# Patient Record
Sex: Male | Born: 1980
Health system: Southern US, Community
[De-identification: ages and names within clinical notes are randomized; demographics above are authoritative.]

## PROBLEM LIST (undated history)

## (undated) DIAGNOSIS — F429 Obsessive-compulsive disorder, unspecified: Secondary | ICD-10-CM

## (undated) DIAGNOSIS — F329 Major depressive disorder, single episode, unspecified: Secondary | ICD-10-CM

## (undated) DIAGNOSIS — F32A Depression, unspecified: Secondary | ICD-10-CM

## (undated) DIAGNOSIS — G4700 Insomnia, unspecified: Secondary | ICD-10-CM

## (undated) HISTORY — DX: Obsessive-compulsive disorder, unspecified: F42.9

## (undated) HISTORY — DX: Depression, unspecified: F32.A

## (undated) HISTORY — DX: Major depressive disorder, single episode, unspecified: F32.9

## (undated) HISTORY — DX: Insomnia, unspecified: G47.00

---

## 2002-03-28 ENCOUNTER — Ambulatory Visit (HOSPITAL_COMMUNITY): Admission: RE | Admit: 2002-03-28 | Discharge: 2002-03-28 | Payer: Self-pay | Admitting: Family Medicine

## 2002-03-28 ENCOUNTER — Encounter: Payer: Self-pay | Admitting: Family Medicine

## 2002-09-12 ENCOUNTER — Emergency Department (HOSPITAL_COMMUNITY): Admission: EM | Admit: 2002-09-12 | Discharge: 2002-09-12 | Payer: Self-pay | Admitting: Emergency Medicine

## 2002-09-12 ENCOUNTER — Encounter: Payer: Self-pay | Admitting: Emergency Medicine

## 2003-07-30 ENCOUNTER — Other Ambulatory Visit (HOSPITAL_COMMUNITY): Admission: RE | Admit: 2003-07-30 | Discharge: 2003-08-04 | Payer: Self-pay | Admitting: Psychiatry

## 2003-07-30 ENCOUNTER — Emergency Department (HOSPITAL_COMMUNITY): Admission: EM | Admit: 2003-07-30 | Discharge: 2003-07-30 | Payer: Self-pay | Admitting: *Deleted

## 2004-07-12 ENCOUNTER — Emergency Department (HOSPITAL_COMMUNITY): Admission: EM | Admit: 2004-07-12 | Discharge: 2004-07-12 | Payer: Self-pay | Admitting: Emergency Medicine

## 2004-07-13 ENCOUNTER — Ambulatory Visit: Payer: Self-pay | Admitting: Psychology

## 2004-07-20 ENCOUNTER — Ambulatory Visit: Payer: Self-pay | Admitting: Orthopedic Surgery

## 2007-11-02 ENCOUNTER — Emergency Department (HOSPITAL_COMMUNITY): Admission: EM | Admit: 2007-11-02 | Discharge: 2007-11-02 | Payer: Self-pay | Admitting: Emergency Medicine

## 2007-11-02 ENCOUNTER — Emergency Department (HOSPITAL_COMMUNITY): Admission: EM | Admit: 2007-11-02 | Discharge: 2007-11-03 | Payer: Self-pay | Admitting: Emergency Medicine

## 2008-12-16 ENCOUNTER — Emergency Department (HOSPITAL_COMMUNITY): Admission: EM | Admit: 2008-12-16 | Discharge: 2008-12-16 | Payer: Self-pay | Admitting: Emergency Medicine

## 2008-12-17 ENCOUNTER — Other Ambulatory Visit (HOSPITAL_COMMUNITY): Admission: RE | Admit: 2008-12-17 | Discharge: 2008-12-22 | Payer: Self-pay | Admitting: Psychiatry

## 2010-06-13 ENCOUNTER — Ambulatory Visit (HOSPITAL_COMMUNITY): Admission: RE | Admit: 2010-06-13 | Discharge: 2010-06-13 | Payer: Self-pay | Admitting: Family Medicine

## 2010-06-14 ENCOUNTER — Ambulatory Visit (HOSPITAL_COMMUNITY): Admission: AD | Admit: 2010-06-14 | Discharge: 2010-06-14 | Payer: Self-pay | Admitting: Urology

## 2010-10-18 LAB — SURGICAL PCR SCREEN
MRSA, PCR: NEGATIVE
Staphylococcus aureus: NEGATIVE

## 2010-12-20 NOTE — Discharge Summary (Signed)
NAMEKEANAN, MELANDER               ACCOUNT NO.:  [id number]   MEDICAL RECORD NO.:  [id number]          PATIENT TYPE:  EMS   LOCATION:  ED                            FACILITY:  [facility]   PHYSICIAN:  Donna Bernard, M.D.DATE OF BIRTH:  [birth year]   DATE OF ADMISSION:  12/16/2008  DATE OF DISCHARGE:  05/12/2010LH                               DISCHARGE SUMMARY   CHIEF COMPLAINT:  Rapid heart rate, shortness of breath, worsening of  depression.   HISTORY OF PRESENT ILLNESS:  This patient is a 30 year old white male  with longstanding history of obsessive-compulsive disorder.  He has been  on medication for this for many years.  He generally takes Luvox, his  current dose is 200 mg daily.  About 5 years ago, he had a bout of  depression.  He saw a psychiatrist and there was a question of bipolar  at that time.  The patient did not stay very long with the psychiatrist  at that point.  Recently, he has had a couple of spells where he gets  extremely nervous and anxious, this was accompanied by rapid heart rate.  He feels a sense of shortness of breath.  He also notes in recent weeks,  he has had periods of feeling very down and low.  He is not actively  planned suicide, but he has certainly has thought about death and shares  some kind of thoughts of what is the use at times when it comes for  him to stand alive.  In other words, he has thought of dying and he has  even thought of potentially harming himself, but he has no active plan.  He states today he has no active desire to hurt or kill himself, and he  has been thinking about it frequently lately.  The patient has been  feeling down.  He has had some crying spells.  His frustration level was  rising more significantly lately.  He does not enjoy his current job.  He is frustrated by the fact that he has no regular relationship these  days and makes him wonder if he has made mistakes in this regard in the  past.  Currently, no chest  pain or shortness of breath or rapid heart  rate.   CURRENT MEDICATIONS:  Luvox 200 mg daily.   ADDENDUM:  The patient is on Wellbutrin 150 SR b.i.d., this was  initiated within the past week and a half.  The patient cannot tell  whether his symptoms have worsened since this but these certainly have  not improved.   PHYSICAL EXAMINATION:  VITAL SIGNS:  BP 150/80, pulse 80, respiratory  rate 18 breaths per minute.  GENERAL:  The patient is alert, appropriate affect, somewhat subdued,  not cheerful, oriented x3.  No delusions.  No hallucinations.  Admits to  feeling down, see present illness.  HEENT:  Normal.  NECK:  Supple.  LUNGS:  Clear.  HEART:  Regular rate and rhythm.   IMPRESSION:  Depression worsening, generally Wellbutrin does not lead to  this, but I have asked the patient to hold  off on Wellbutrin for now.  The patient is to maintain Luvox.  I am quite concerned about this  patient.  He is accompanied by both his parents today, both of whom are  very concerned.  I spoke with the intake person at [facility]  down at [facility].  I do not know whether they will recommend inpatient  management or not.  I do think, though they are getting him down there this evening, is it  the absolutely right thing to do and we will increase their speed of  [facility].  I have advised them that at  this condition, I really think he needs a psychiatrist long term.  Questions were answered.  Plan as above.      Donna Bernard, M.D.  Electronically Signed     WSL/MEDQ  D:  12/21/2008  T:  12/22/2008  Job:  [id number]

## 2011-03-20 ENCOUNTER — Ambulatory Visit (HOSPITAL_COMMUNITY)
Admission: RE | Admit: 2011-03-20 | Discharge: 2011-03-20 | Disposition: A | Discharge status: Home / Self Care | Payer: BC Managed Care – PPO | Source: Ambulatory Visit | Attending: Family Medicine | Admitting: Family Medicine

## 2011-03-20 ENCOUNTER — Other Ambulatory Visit: Payer: Self-pay | Admitting: Family Medicine

## 2011-03-20 DIAGNOSIS — F17200 Nicotine dependence, unspecified, uncomplicated: Secondary | ICD-10-CM | POA: Insufficient documentation

## 2011-03-20 DIAGNOSIS — R52 Pain, unspecified: Secondary | ICD-10-CM

## 2011-03-20 DIAGNOSIS — R0789 Other chest pain: Secondary | ICD-10-CM | POA: Insufficient documentation

## 2011-05-01 LAB — CBC
HCT: 37 — ABNORMAL LOW
Hemoglobin: 13.3
MCHC: 36
MCV: 87.3
Platelets: 182
RBC: 4.24
RDW: 13.3
WBC: 8.2

## 2011-05-01 LAB — URINALYSIS, ROUTINE W REFLEX MICROSCOPIC
Bilirubin Urine: NEGATIVE
Glucose, UA: NEGATIVE
Leukocytes, UA: NEGATIVE
Nitrite: NEGATIVE
Protein, ur: 100 — AB
Specific Gravity, Urine: 1.025
Urobilinogen, UA: 0.2
pH: 6

## 2011-05-01 LAB — URINE MICROSCOPIC-ADD ON

## 2011-05-01 LAB — BASIC METABOLIC PANEL
BUN: 9
CO2: 25
Calcium: 9.3
Chloride: 104
Creatinine, Ser: 1.4
GFR calc Af Amer: >60
GFR calc non Af Amer: >60
Glucose, Bld: 111 — ABNORMAL HIGH
Potassium: 3.1 — ABNORMAL LOW
Sodium: 137

## 2011-05-01 LAB — DIFFERENTIAL
Basophils Absolute: 0
Basophils Relative: 0
Eosinophils Absolute: 0.1
Eosinophils Relative: 2
Lymphocytes Relative: 28
Lymphs Abs: 2.3
Monocytes Absolute: 0.4
Monocytes Relative: 5
Neutro Abs: 5.4
Neutrophils Relative %: 65

## 2012-06-07 IMAGING — CT CT ABD-PELV W/O CM
3 of 4 series · 9 of 46 positions shown, 16 images · non-contrast
Comparison: 11/02/2007

CLINICAL DATA: Gross hematuria.  Left flank pain.  Urolithiasis.

CT ABDOMEN AND PELVIS WITHOUT CONTRAST
TECHNIQUE: Multidetector CT imaging of the abdomen and pelvis was
performed following the standard protocol without intravenous
contrast.

[Series 3: lung 5.0 b60f · axial · 0.67mm/px · z∈[-180,-100]mm · 5 of 26 slices shown, 10 images]
[im 5/26  soft-tissue]
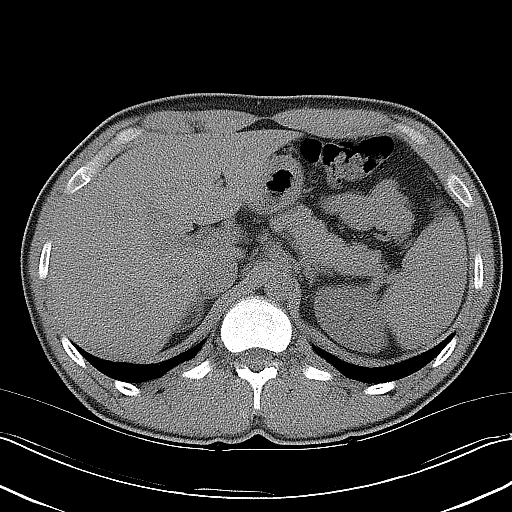
[im 5/26  bone]
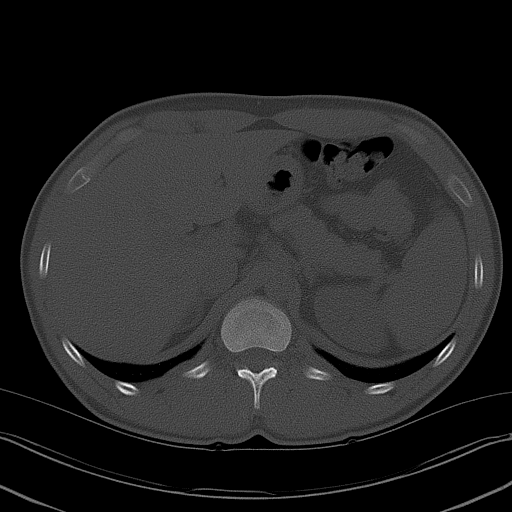
[im 9/26  soft-tissue]
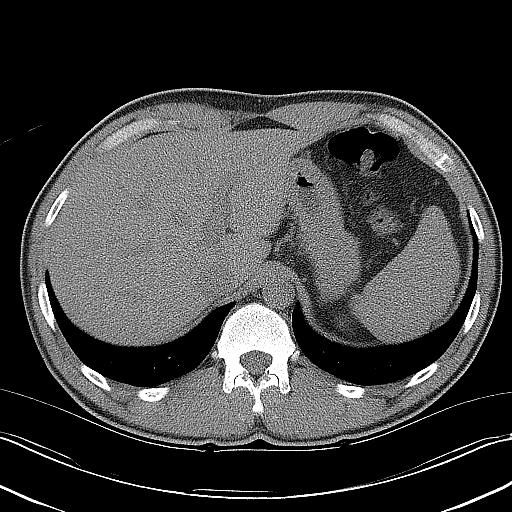
[im 9/26  lung]
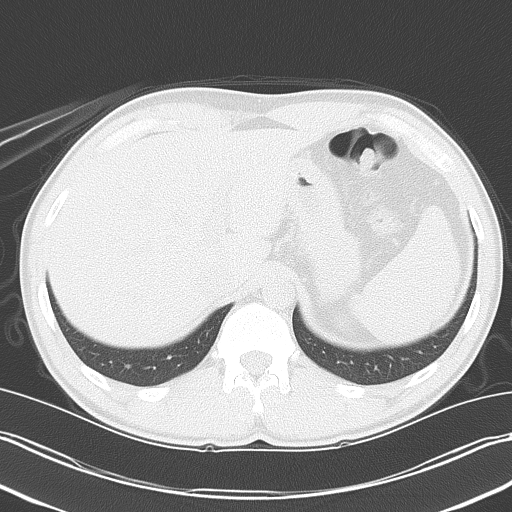
[im 13/26  soft-tissue]
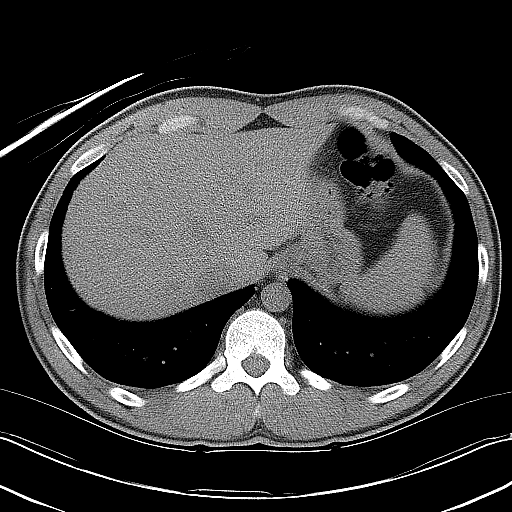
[im 13/26  lung]
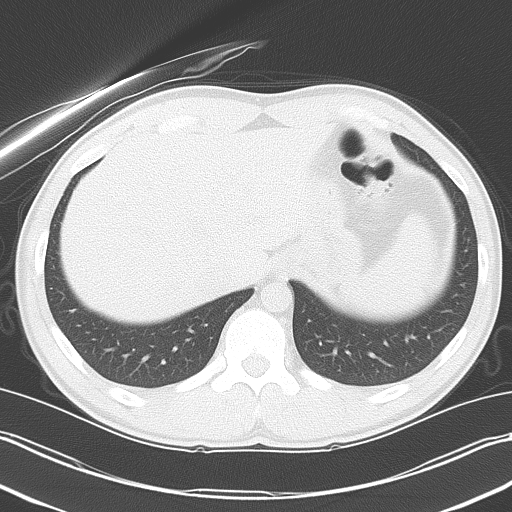
[im 17/26  soft-tissue]
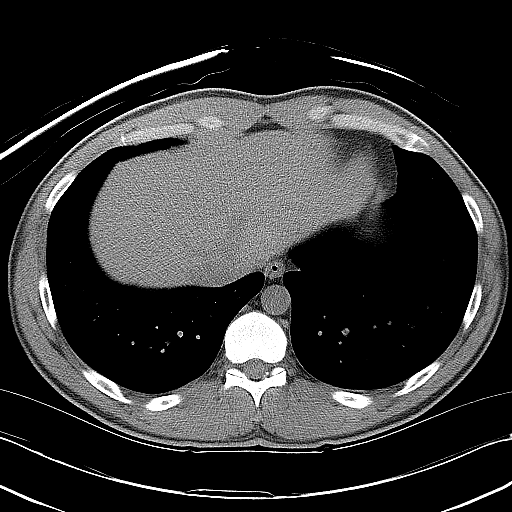
[im 17/26  lung]
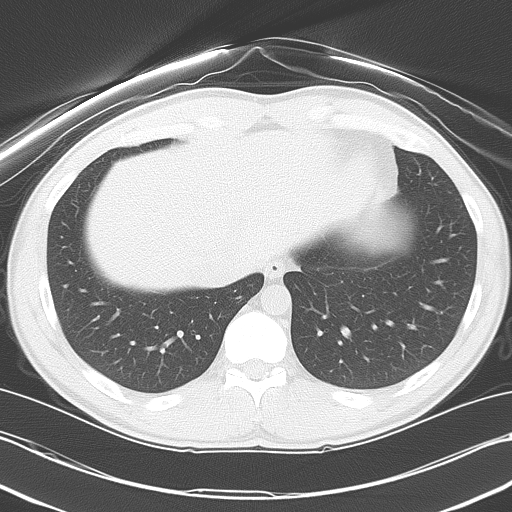
[im 21/26  soft-tissue]
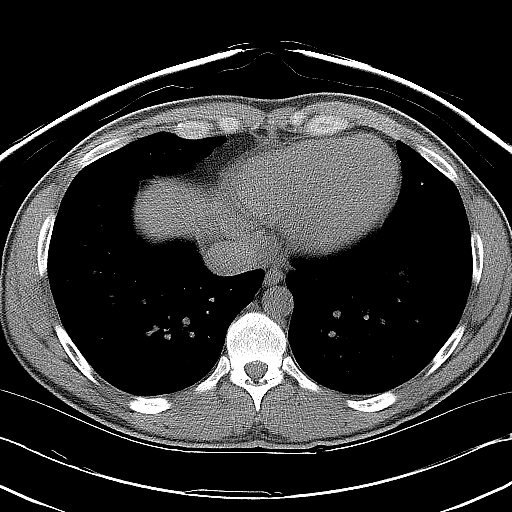
[im 21/26  lung]
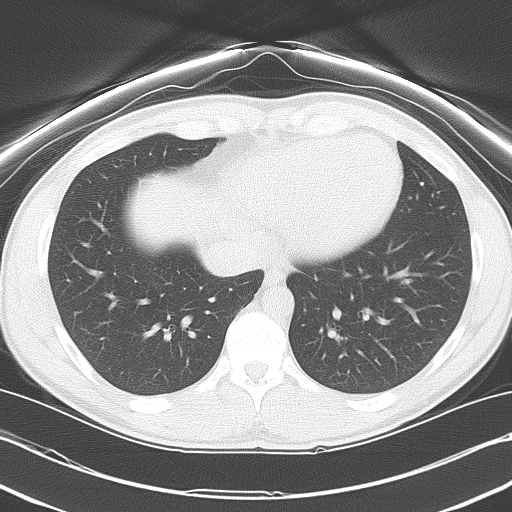

[Series 4: mpr coronal (id) · coronal · 0.69mm/px · 3 of 78 slices shown, 4 images]
[im 26/78  soft-tissue]
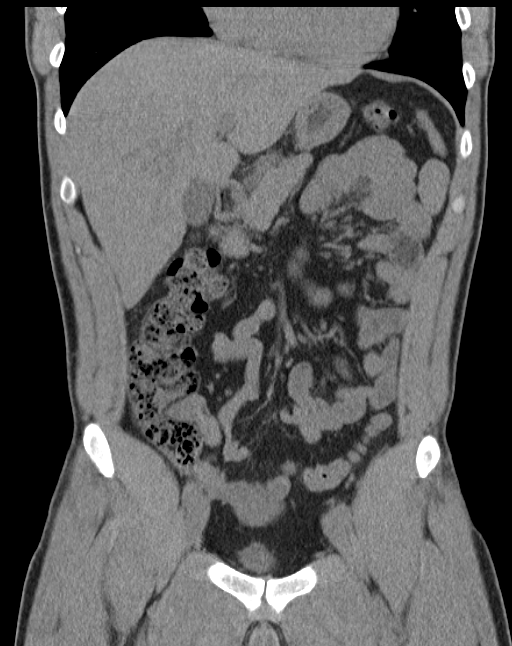
[im 35/78  soft-tissue]
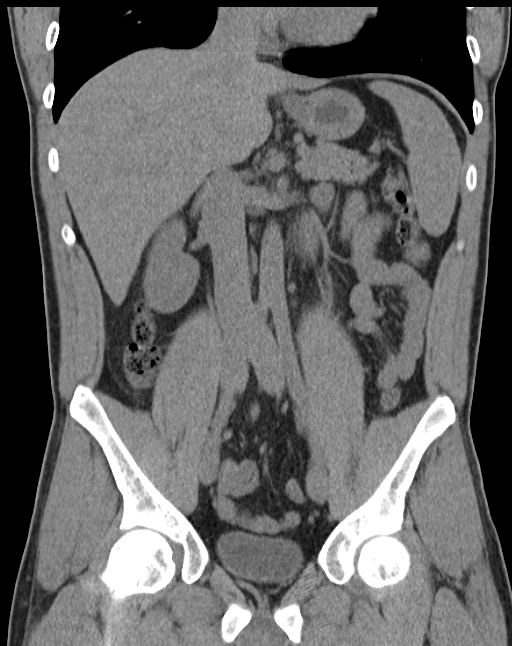
[im 35/78  bone]
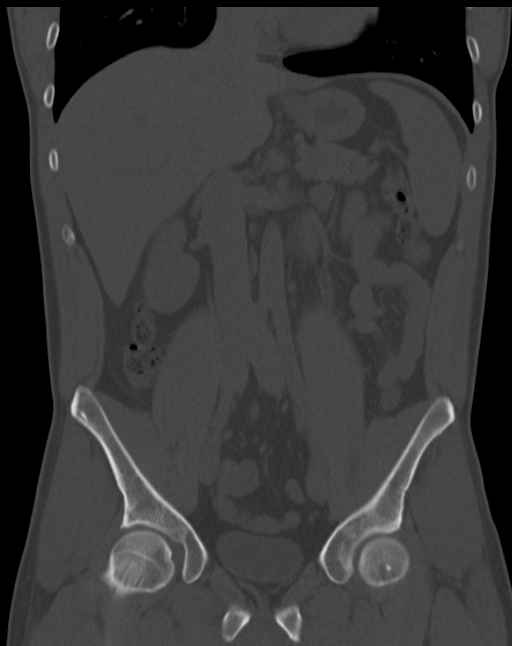
[im 43/78  soft-tissue]
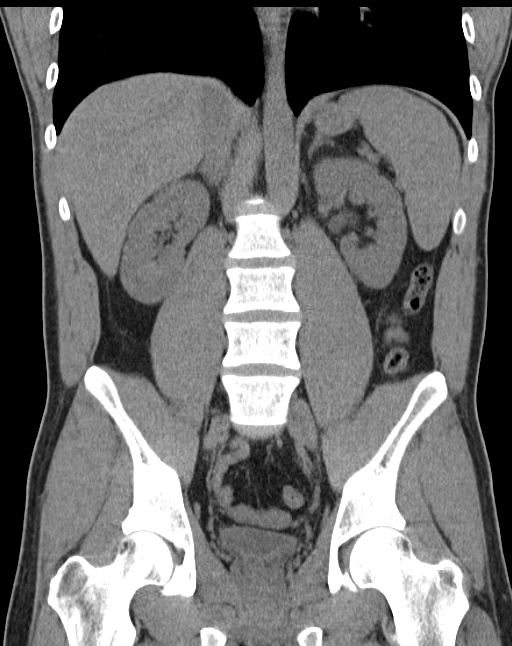

[Series 5: mpr sagittal (id) · sagittal · 0.50mm/px · 1 of 108 slices shown, 2 images]
[im 36/108  soft-tissue]
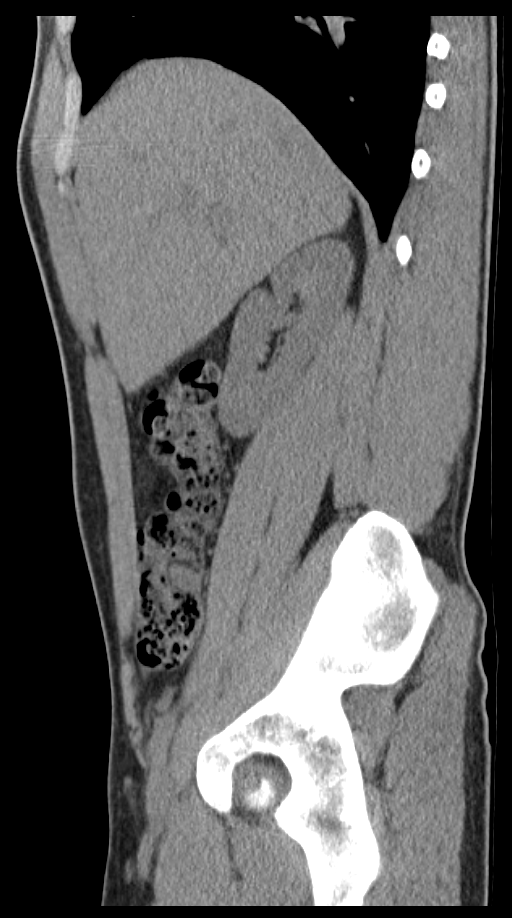
[im 36/108  bone]
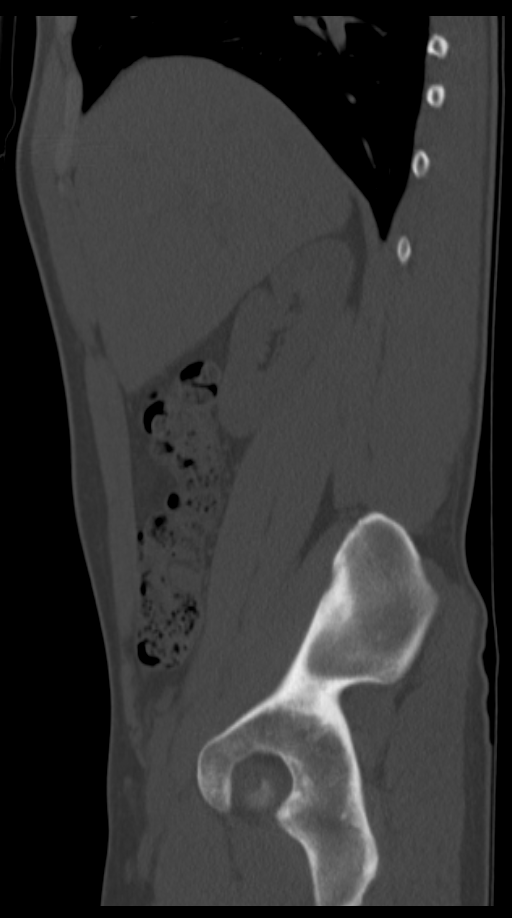

[9 of 46 positions shown; findings below may reference images not displayed]

FINDINGS: Mild left hydronephrosis and ureterectasis is seen.  A 4
mm calculus is seen in the distal left ureter, approximately 1 cm
from the ureterovesicle junction.  No evidence of intrarenal
calculi.

The other abdominal parenchymal organs have a normal appearance on
this noncontrast study.  No soft tissue masses are identified.  No
evidence of inflammatory process or abnormal fluid collections.
Unopacified bowel has a normal appearance.
IMPRESSION: 4 mm distal left ureteral calculus, causing mild left
hydronephrosis.

## 2012-12-27 ENCOUNTER — Ambulatory Visit (INDEPENDENT_AMBULATORY_CARE_PROVIDER_SITE_OTHER): Payer: 59 | Admitting: Family Medicine

## 2012-12-27 ENCOUNTER — Encounter: Payer: Self-pay | Admitting: Family Medicine

## 2012-12-27 VITALS — BP 120/72 | Temp 98.7°F | Ht 69.75 in | Wt 187.0 lb

## 2012-12-27 DIAGNOSIS — J029 Acute pharyngitis, unspecified: Secondary | ICD-10-CM

## 2012-12-27 MED ORDER — AZITHROMYCIN 250 MG PO TABS: ORAL_TABLET | ORAL | Status: DC

## 2012-12-27 NOTE — Progress Notes (Signed)
  Subjective:    Patient ID: Robert Walsh, male    DOB: [birth year], 32 y.o.   MRN: [id number]  Sore Throat  This is a new problem. The current episode started in the past 7 days. The problem has been unchanged. Neither side of throat is experiencing more pain than the other. Maximum temperature: 99.5. The fever has been present for less than 1 day. The pain is at a severity of 4/10. The pain is mild. Associated symptoms comments: Nausea, body aches. He has tried cool liquids for the symptoms. The treatment provided mild relief.    Past medical history benign family history benign  Review of Systems He denies any high fevers. He does state he feels bad. Moderate discomfort.    Objective:   Physical Exam Eardrums are normal throat erythematous with exudate on the right side neck is supple minimal adenopathy patient looks to fill ill but he does not appear toxic lungs are clear heart is regular pulses normal no masses are felt.  Rapid strep test negative.     Assessment & Plan:  Pharyngitis-we'll go ahead and cover for possibility of infection with Zithromax. Patient was told if over the next 5 or 6 days his throat doesn't improve I would like to do blood work including CBC and mono testing. Patient will call us if ongoing troubles.

## 2012-12-28 LAB — STREP A DNA PROBE: GASP: NEGATIVE

## 2013-01-13 ENCOUNTER — Other Ambulatory Visit (HOSPITAL_COMMUNITY): Payer: Self-pay | Admitting: Family Medicine

## 2013-01-14 ENCOUNTER — Encounter: Payer: Self-pay | Admitting: *Deleted

## 2013-02-28 ENCOUNTER — Encounter: Payer: Self-pay | Admitting: Family Medicine

## 2013-02-28 ENCOUNTER — Ambulatory Visit (INDEPENDENT_AMBULATORY_CARE_PROVIDER_SITE_OTHER): Payer: PRIVATE HEALTH INSURANCE | Admitting: Family Medicine

## 2013-02-28 VITALS — BP 120/84 | HR 70 | Wt 188.5 lb

## 2013-02-28 DIAGNOSIS — F429 Obsessive-compulsive disorder, unspecified: Secondary | ICD-10-CM

## 2013-02-28 DIAGNOSIS — G4700 Insomnia, unspecified: Secondary | ICD-10-CM

## 2013-02-28 MED ORDER — SERTRALINE HCL 100 MG PO TABS: 100.0000 mg | ORAL_TABLET | Freq: Every day | ORAL | Status: DC

## 2013-02-28 NOTE — Progress Notes (Signed)
  Subjective:    Patient ID: Robert Walsh, male    DOB: 1980/09/17, 32 y.o.   MRN: [id number]  HPI  Taking med regularly.  Energy decent. No suicidal or homicidal thoughts. Exercising regularly. Some trouble sleeping at night. This is a long-standing issue.  Feeling down at times. Overall god control  Review of Systems No chest pain no abdominal pain no back pain ROS otherwise negative    Objective:   Physical Exam  Alert HEENT normal. Lungs clear. Heart regular in rhythm. Neuro intact.      Assessment & Plan:  Impression insomnia with element of depression and OCD clinically stable at this time. Plan maintain same meds. Diet exercise discussed in encourage. Check in 6 months. WSL

## 2013-03-02 DIAGNOSIS — F429 Obsessive-compulsive disorder, unspecified: Secondary | ICD-10-CM | POA: Insufficient documentation

## 2013-03-02 DIAGNOSIS — G4700 Insomnia, unspecified: Secondary | ICD-10-CM | POA: Insufficient documentation

## 2013-03-14 IMAGING — CR DG CHEST 2V
2 series · 2 of 2 positions shown · non-contrast
Comparison: 11/02/2007

CLINICAL DATA: Right anterior chest pain, smoker

CHEST - 2 VIEW

[view not recorded (1 of 2)]
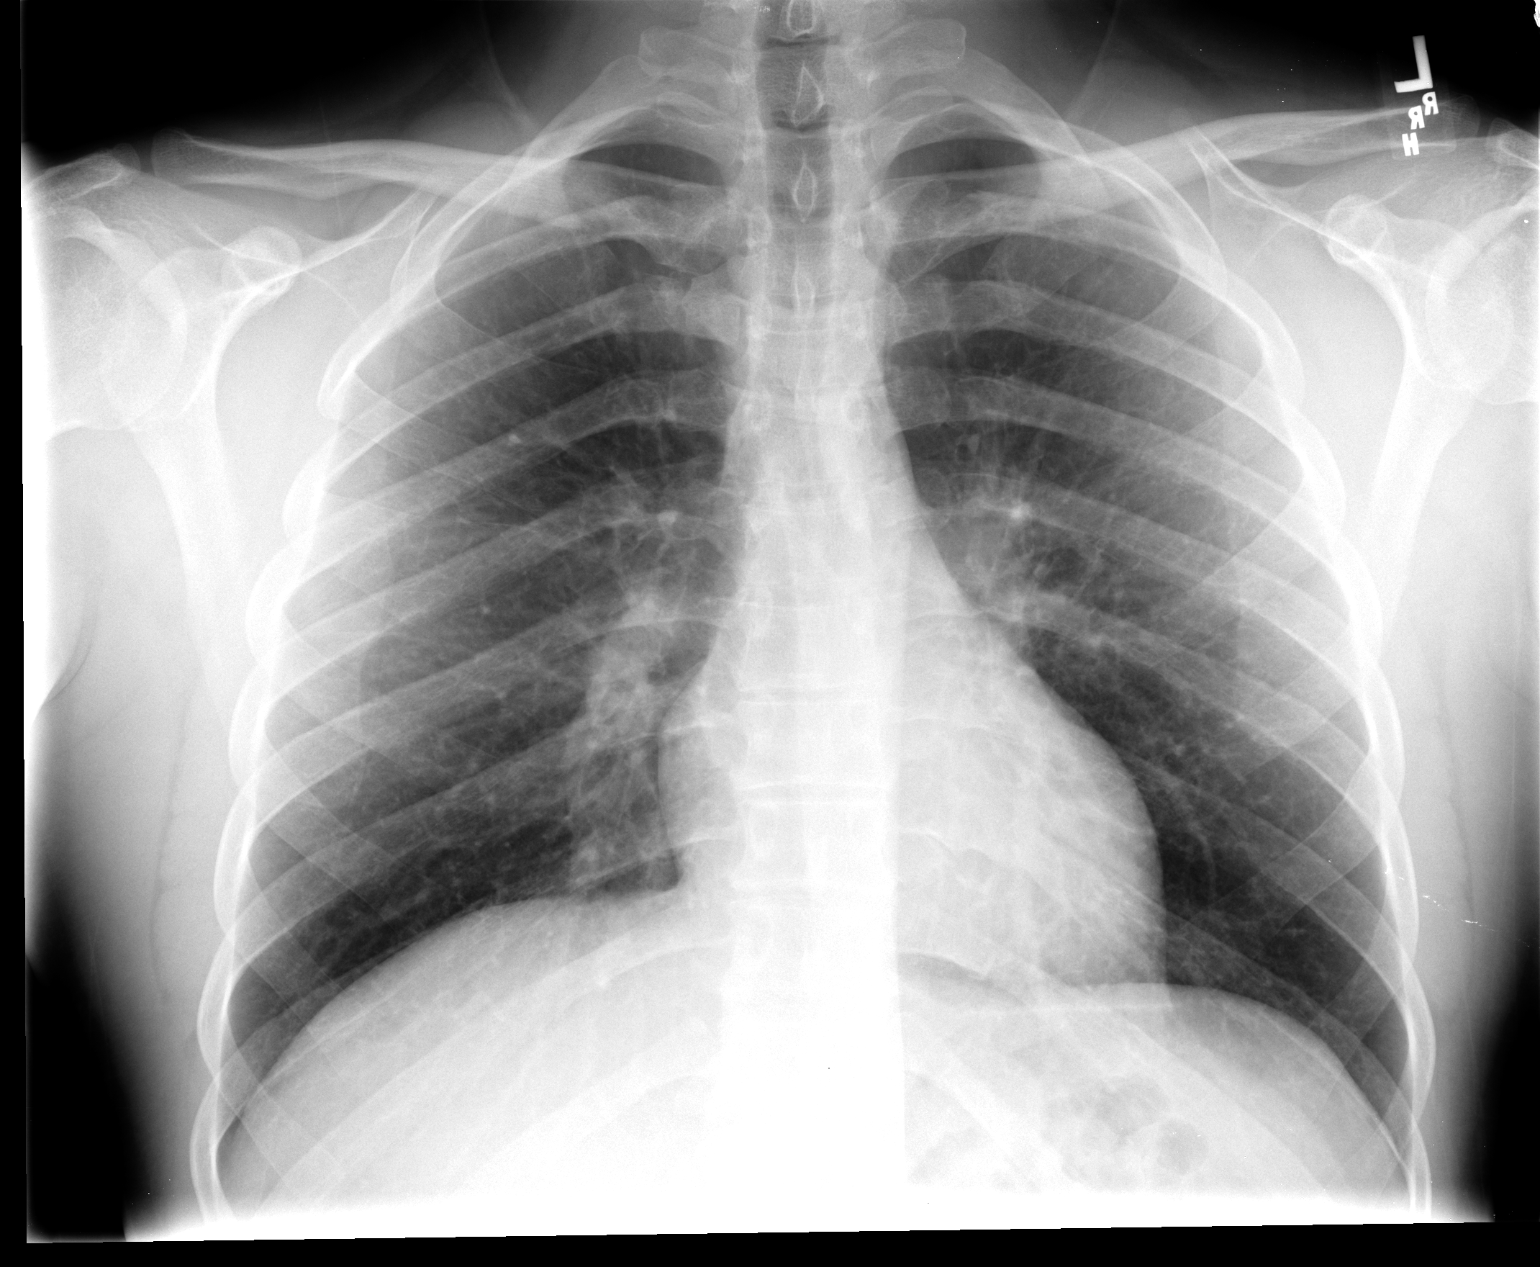

[view not recorded (2 of 2)]
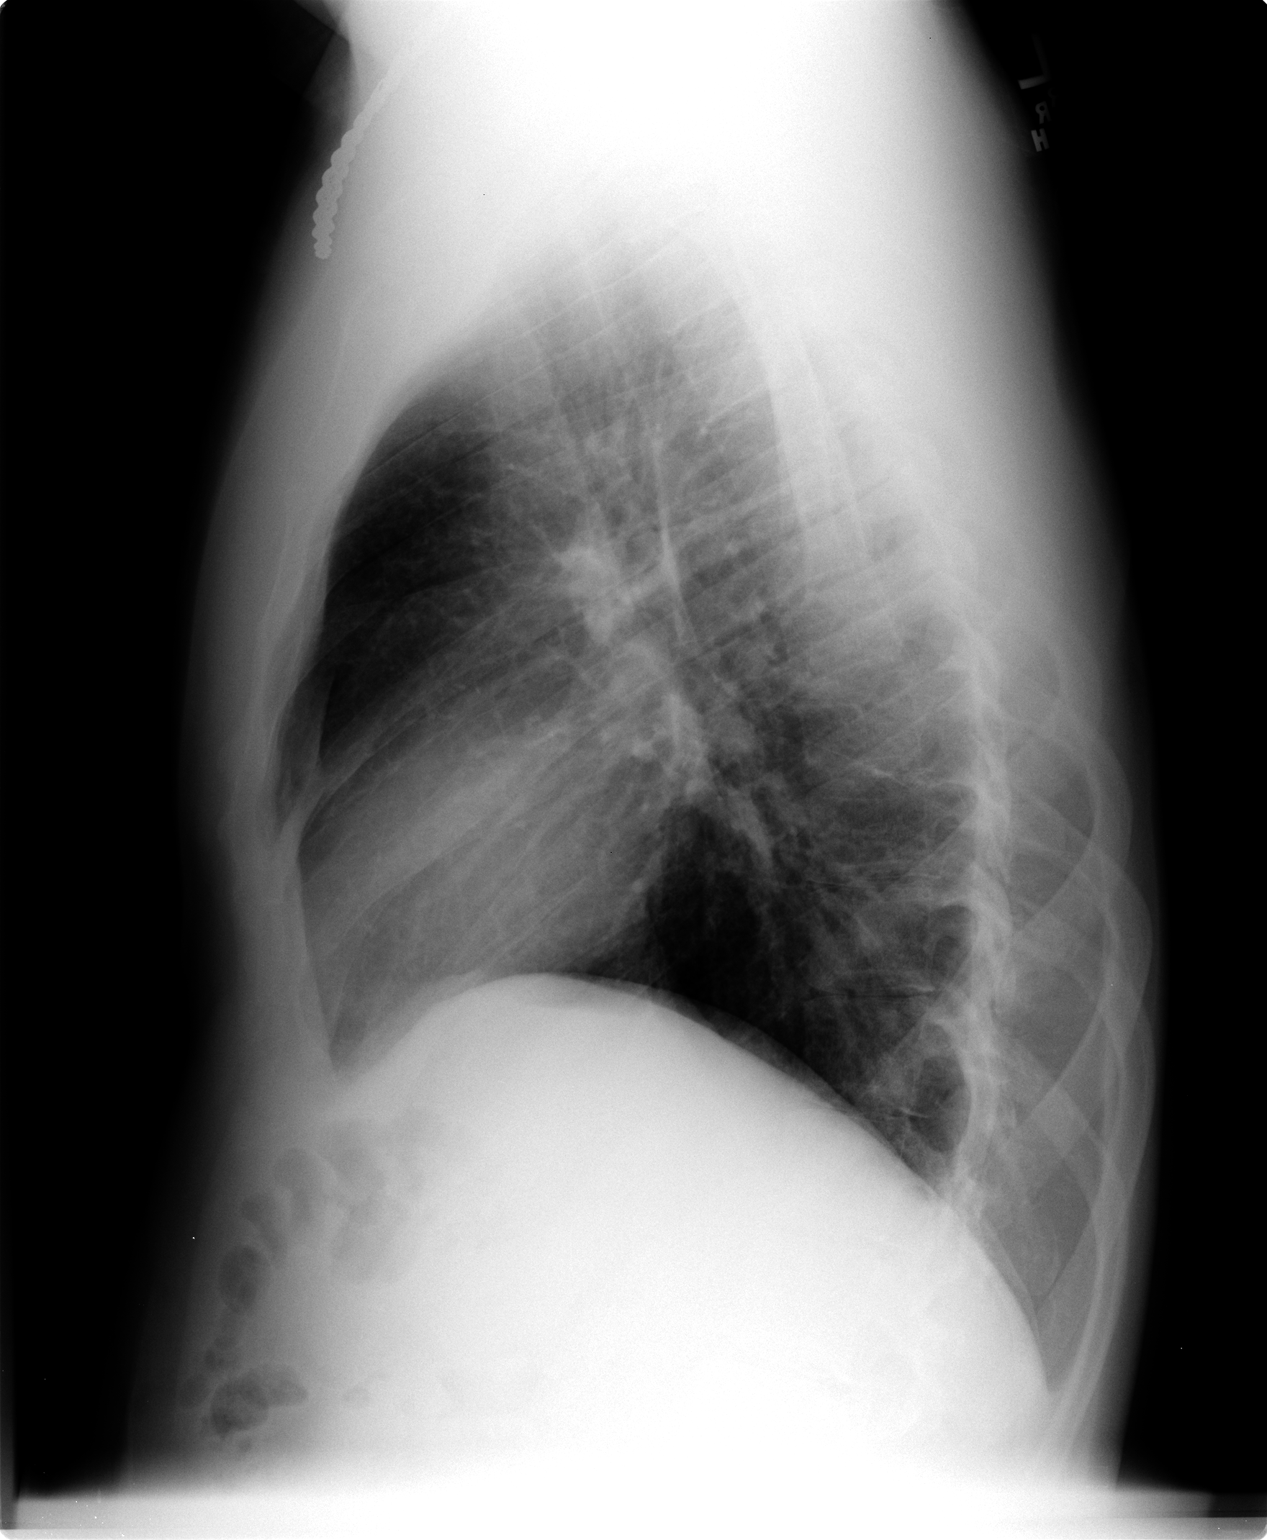

[2 of 2 positions shown; findings below may reference images not displayed]

FINDINGS: Normal heart size, mediastinal contours, and pulmonary vascularity.
Minimal chronic peribronchial thickening.
No pulmonary infiltrate, pleural effusion or pneumothorax.
No acute osseous findings.
IMPRESSION: Minimal chronic bronchitic changes.
No acute abnormalities.

## 2014-02-12 ENCOUNTER — Encounter: Payer: Self-pay | Admitting: Family Medicine

## 2014-02-12 ENCOUNTER — Ambulatory Visit (INDEPENDENT_AMBULATORY_CARE_PROVIDER_SITE_OTHER): Payer: 59 | Admitting: Family Medicine

## 2014-02-12 VITALS — BP 132/94 | Ht 69.0 in | Wt 172.0 lb

## 2014-02-12 DIAGNOSIS — G4700 Insomnia, unspecified: Secondary | ICD-10-CM

## 2014-02-12 DIAGNOSIS — R5381 Other malaise: Secondary | ICD-10-CM | POA: Insufficient documentation

## 2014-02-12 DIAGNOSIS — R5383 Other fatigue: Secondary | ICD-10-CM

## 2014-02-12 DIAGNOSIS — F429 Obsessive-compulsive disorder, unspecified: Secondary | ICD-10-CM

## 2014-02-12 MED ORDER — SERTRALINE HCL 100 MG PO TABS: 100.0000 mg | ORAL_TABLET | Freq: Every day | ORAL | Status: DC

## 2014-02-12 NOTE — Progress Notes (Signed)
   Subjective:    Patient ID: Robert Walsh, male    DOB: [birth year], 33 y.o.   MRN: [id number]  HPIFollow up on OCD and anxiety. Not currently taking any meds.   Discuss testosterone level. Patient concerned about muscle mass. He's been lifting weights. Feels he does not have a muscle enough. Wonders about testosterone supplementation and testing.  Trouble sleeping at night. Progressive in recent months. Primarily accompanying the abscess of compulsive disorder. Definitely getting into compulsions again. Also abscessing on his weight.  No headache no chest pain. No abdominal pain. No change in bowel habits.  Exercising a lot, dropped twenty pounds       Review of Systems No rash no joint pain no vomiting no diarrhea ROS otherwise negative Review systems otherwise negative    Objective:   Physical Exam  Alert no acute distress. HEENT normal. Lungs clear. Heart regular in rhythm. Ankles normal. Muscle mass good.      Assessment & Plan:  Impression 1 insomnia discussed at length #2 fatigue discussed at length likely most associated with #3 #3 OCD and anxiety off meds having difficulties. #4 testosterone discussion long discussion held. At this point patient has decided to hold off on testing. Plan 25 minutes spent most in discussion. Medications refilled. Diet exercise discussed. Recheck in 6 months. WSL

## 2014-09-04 ENCOUNTER — Ambulatory Visit (INDEPENDENT_AMBULATORY_CARE_PROVIDER_SITE_OTHER): Payer: 59 | Admitting: Family Medicine

## 2014-09-04 VITALS — BP 132/88

## 2014-09-04 DIAGNOSIS — F429 Obsessive-compulsive disorder, unspecified: Secondary | ICD-10-CM

## 2014-09-04 DIAGNOSIS — F42 Obsessive-compulsive disorder: Secondary | ICD-10-CM

## 2014-09-04 DIAGNOSIS — G4700 Insomnia, unspecified: Secondary | ICD-10-CM

## 2014-09-04 MED ORDER — SERTRALINE HCL 100 MG PO TABS: 100.0000 mg | ORAL_TABLET | Freq: Every day | ORAL | Status: DC

## 2014-09-04 NOTE — Progress Notes (Signed)
   Subjective:    Patient ID: Robert Walsh, male    DOB: [birth year], 34 y.o.   MRN: [id number]  HPI  Patient is here today for a refill on his Zoloft.   No other concerns.   Still exercising working out [employer]  Sticking with meds  Sleeping well at night  Gets up 4 30 ea morn so sometimes running tired towards in the day.  Does not miss a dose of his medication. Next  Still definitely helps his OCD symptoms. No major difficulties with depression at this time. Occasional anxiety. Review of Systems No headache no chest pain no back pain no abdominal pain no change in bowel habits no blood in stool    Objective:   Physical Exam Alert vitals stable no acute distress H&T normal. Lungs clear heart rare rhythm neuro intact  impression OCD clinically stable plan diet exercise discussed. Compliant. Discussed medications refilled. WSL       Assessment & Plan:  See above

## 2015-03-01 ENCOUNTER — Ambulatory Visit (INDEPENDENT_AMBULATORY_CARE_PROVIDER_SITE_OTHER): Payer: 59 | Admitting: Family Medicine

## 2015-03-01 ENCOUNTER — Encounter: Payer: Self-pay | Admitting: Family Medicine

## 2015-03-01 VITALS — BP 132/84 | Ht 69.0 in | Wt 179.6 lb

## 2015-03-01 DIAGNOSIS — G4700 Insomnia, unspecified: Secondary | ICD-10-CM

## 2015-03-01 DIAGNOSIS — F42 Obsessive-compulsive disorder: Secondary | ICD-10-CM

## 2015-03-01 DIAGNOSIS — F429 Obsessive-compulsive disorder, unspecified: Secondary | ICD-10-CM

## 2015-03-01 MED ORDER — SERTRALINE HCL 100 MG PO TABS: 100.0000 mg | ORAL_TABLET | Freq: Every day | ORAL | Status: DC

## 2015-03-01 NOTE — Progress Notes (Signed)
   Subjective:    Patient ID: Robert Walsh, male    DOB: [birth year], 34 y.o.   MRN: [id number]  HPI  Patient arrives for a follow up on OCD.   Patient states he is doing well-no concerns.  Missed a lot of zoloft does these days  Has episodes of anxiety. Also notes episodes of trouble sleeping. Has new job. Some stress with work. No suicidal or homicidal thoughts    Review of Systems Occasional headache no chest pain no back pain no abdominal pain    Objective:   Physical Exam  Alert vitals stable no acute distress H&T normal lungs clear heart regular in rhythm.      Assessment & Plan:  Impression OCD disorder stable number next generalized anxieties stable plan appliance and medicine discussed in encourage. Meds refilled. Diet discussed in encourage WSL

## 2015-05-05 ENCOUNTER — Ambulatory Visit (INDEPENDENT_AMBULATORY_CARE_PROVIDER_SITE_OTHER): Payer: 59 | Admitting: Family Medicine

## 2015-05-05 ENCOUNTER — Encounter: Payer: Self-pay | Admitting: Family Medicine

## 2015-05-05 VITALS — Ht 69.0 in | Wt 181.4 lb

## 2015-05-05 DIAGNOSIS — M659 Synovitis and tenosynovitis, unspecified: Secondary | ICD-10-CM | POA: Diagnosis not present

## 2015-05-05 MED ORDER — PREDNISONE 20 MG PO TABS: ORAL_TABLET | ORAL | Status: DC

## 2015-05-05 MED ORDER — HYDROCODONE-ACETAMINOPHEN 5-325 MG PO TABS: 1.0000 | ORAL_TABLET | ORAL | Status: DC | PRN

## 2015-05-05 NOTE — Progress Notes (Signed)
   Subjective:    Patient ID: Robert Walsh, male    DOB: 1980/10/18, 34 y.o.   MRN: [id number]  Hand Pain  The incident occurred 3 to 5 days ago. The injury mechanism is unknown. The pain is present in the left hand. The quality of the pain is described as aching.  relates the pain is throbbing comes and goes present in the past few days intense enough to wake him up at night radiates into the ring and middle finger no known injury  Patient states no other concerns this visit  Review of Systems Denies any fever chills injury.    Objective:   Physical Exam No redness or cellulitis has good range of motion. No joint swelling or tenderness tenderness but does have subjective discomfort in the middle of his hand radiates into the ring and middle finger has good movement of hands negative Tinel's normal pulses       Assessment & Plan:  Probable center bites related to recent infectioncausing some discomfort in the hand radiates into the middle and ring finger I do not find evidence of any type of nerve impingement I recommend short prednisone taper follow-up if problems no need for x-rays or lab work currently

## 2015-05-13 ENCOUNTER — Telehealth: Payer: Self-pay | Admitting: Family Medicine

## 2015-05-13 MED ORDER — NAPROXEN 500 MG PO TABS: 500.0000 mg | ORAL_TABLET | Freq: Two times a day (BID) | ORAL | Status: DC

## 2015-05-13 NOTE — Telephone Encounter (Signed)
Notified patient recommend a strong anti-inflammatory. Naprosyn 500 mg 1 twice a day, #40, 1 refill, if not doing better then may possibly need follow-up office visit(with Dr. Brett Canales his primary care doctor). Patient verbalized understanding. Med sent to pharmacy.

## 2015-05-13 NOTE — Telephone Encounter (Signed)
Seen 9/28. Dx with synovitis. Pt finished prednisone this am. Pt states pain was gone on sat and sun. Came back on Monday, tues and wed pain was rough. Tried ibprofen without relief. Started back taking hydrocodone one every 6 -7 hours. Pain is worse in ring and middle finger. No swelling, redness or fever.

## 2015-05-13 NOTE — Telephone Encounter (Signed)
I would recommend a strong anti-inflammatory. Naprosyn 500 mg 1 twice a day, #40, 1 refill, if not doing better then may possibly need follow-up office visit(with Dr. Brett Canales his primary care doctor)

## 2015-05-13 NOTE — Telephone Encounter (Signed)
Pt seen Dr Lorin Picket on 9/28 for his elbow, hand, fingers, was told to call  Back in 5 days if he did not feel better. He states his pain has localized  In his hand. He finished his prednisone this morning. He would like to know What you would recommend at this point.   [facility]

## 2015-07-27 ENCOUNTER — Telehealth: Payer: Self-pay | Admitting: Family Medicine

## 2015-07-27 DIAGNOSIS — M79642 Pain in left hand: Secondary | ICD-10-CM

## 2015-07-27 NOTE — Telephone Encounter (Signed)
Referral in the system. [facility] (vm not setup yet)

## 2015-07-27 NOTE — Telephone Encounter (Signed)
Lets do per pt reauest see prior o v

## 2015-07-27 NOTE — Telephone Encounter (Signed)
[facility] Dr[facility] Janalyn Rouseshaili devedshwar .States still having problems with hand.

## 2015-07-30 ENCOUNTER — Encounter: Payer: Self-pay | Admitting: Family Medicine

## 2015-08-04 ENCOUNTER — Encounter: Payer: Self-pay | Admitting: Family Medicine

## 2015-08-06 ENCOUNTER — Ambulatory Visit (INDEPENDENT_AMBULATORY_CARE_PROVIDER_SITE_OTHER): Payer: 59 | Admitting: Podiatry

## 2015-08-06 ENCOUNTER — Ambulatory Visit (INDEPENDENT_AMBULATORY_CARE_PROVIDER_SITE_OTHER): Payer: 59

## 2015-08-06 ENCOUNTER — Encounter: Payer: Self-pay | Admitting: Podiatry

## 2015-08-06 VITALS — BP 129/81 | HR 64 | Resp 16 | Ht 69.0 in | Wt 180.0 lb

## 2015-08-06 DIAGNOSIS — B351 Tinea unguium: Secondary | ICD-10-CM | POA: Diagnosis not present

## 2015-08-06 DIAGNOSIS — M7660 Achilles tendinitis, unspecified leg: Secondary | ICD-10-CM

## 2015-08-06 DIAGNOSIS — M79673 Pain in unspecified foot: Secondary | ICD-10-CM

## 2015-08-06 NOTE — Progress Notes (Signed)
Subjective:     Patient ID: Robert Walsh, male   DOB: August 05, 1981, 34 y.o.   MRN: [id number]  HPI patient states I've had a lot of problems with my nails turning colors complain basketball pain in the back of my heels and a history of ankle problems   Review of Systems  All other systems reviewed and are negative.      Objective:   Physical Exam  Constitutional: He is oriented to person, place, and time.  Cardiovascular: Intact distal pulses.   Musculoskeletal: Normal range of motion.  Neurological: He is oriented to person, place, and time.  Skin: Skin is warm.  Nursing note and vitals reviewed.  neurovascular status intact muscle strength adequate with high arch foot type with inversion of the rear foot secondary to calcaneal varus deformity. Patient's noted to have callus sub-first metatarsal bilateral secondary to structure damaged hallux nails bilateral with dark discoloration that's localized with no proximal edema erythema drainage and mild discomfort in the Achilles tendon bilateral     Assessment:      foot structure which is inherited which has led to mild discomfort in the Achilles tendon area and damaged hallux nail secondary to shoe gear modifications and sports    Plan:      H&P condition reviewed and recommended stretching exercises heat therapy and heel lifts which were dispensed today. Patient will be seen back for us to recheck again in the next few months or if any issues should occur

## 2015-08-06 NOTE — Progress Notes (Signed)
   Subjective:    Patient ID: Robert Walsh, male    DOB: 06/17/1981, 34 y.o.   MRN: [id number]  HPI Patient presents with bilateral ankle pain;  x6 months  Patient also presents with foot pain in their left foot-back of heel; x3 months  Patient also presents with bilateral nail problem, great toes; nails are black. Pt stated, "plays basketball"; x2 months.  Review of Systems  All other systems reviewed and are negative.      Objective:   Physical Exam        Assessment & Plan:

## 2015-08-06 NOTE — Patient Instructions (Signed)

## 2015-08-13 MED FILL — SERTRALINE HCL 100 MG TAB: 100 | 30 days supply | Qty: 30 | Fill #4

## 2015-08-18 DIAGNOSIS — M7660 Achilles tendinitis, unspecified leg: Secondary | ICD-10-CM | POA: Diagnosis not present

## 2015-08-18 DIAGNOSIS — M2550 Pain in unspecified joint: Secondary | ICD-10-CM | POA: Diagnosis not present

## 2015-08-18 DIAGNOSIS — M79641 Pain in right hand: Secondary | ICD-10-CM | POA: Diagnosis not present

## 2015-08-18 DIAGNOSIS — M79602 Pain in left arm: Secondary | ICD-10-CM | POA: Diagnosis not present

## 2015-08-18 DIAGNOSIS — M542 Cervicalgia: Secondary | ICD-10-CM | POA: Diagnosis not present

## 2015-08-18 DIAGNOSIS — M79642 Pain in left hand: Secondary | ICD-10-CM | POA: Diagnosis not present

## 2015-08-18 MED FILL — MELOXICAM 15 MG TABLET: 15 | 30 days supply | Qty: 30 | Fill #0

## 2015-09-01 ENCOUNTER — Ambulatory Visit: Payer: 59 | Admitting: Family Medicine

## 2015-09-14 ENCOUNTER — Ambulatory Visit (INDEPENDENT_AMBULATORY_CARE_PROVIDER_SITE_OTHER): Payer: 59 | Admitting: Family Medicine

## 2015-09-14 ENCOUNTER — Encounter: Payer: Self-pay | Admitting: Family Medicine

## 2015-09-14 VITALS — BP 128/78 | Ht 69.0 in | Wt 186.6 lb

## 2015-09-14 DIAGNOSIS — F429 Obsessive-compulsive disorder, unspecified: Secondary | ICD-10-CM

## 2015-09-14 MED ORDER — SERTRALINE HCL 100 MG PO TABS: 100.0000 mg | ORAL_TABLET | Freq: Every day | ORAL | Status: DC

## 2015-09-14 MED FILL — SERTRALINE HCL 100 MG TAB: 100 | 30 days supply | Qty: 30 | Fill #0

## 2015-09-14 NOTE — Progress Notes (Signed)
   Subjective:    Patient ID: Robert Walsh, male    DOB: April 05, 1981, 35 y.o.   MRN: [id number]  HPI Patient arrives for a follow up on OCD. No problems or concerns.  Playing b bll every couple days, plus lifting  Tenosynovitis of arm and hand slowly improved  Overall stable with the OCD meds, watching things overall, doew nt miss a dose of meds  Review of Systems No headache, no major weight loss or weight gain, no chest pain no back pain abdominal pain no change in bowel habits complete ROS otherwise negative     Objective:   Physical Exam   alert no apparent distress vital stable blood pressure good on repeat HEENT normal lungs clear heart regular rhythm affect appropriate      Assessment & Plan:   impression OCD discussed meds definitely helped plan maintain meds diet exercise discussed WSL

## 2015-10-27 ENCOUNTER — Encounter: Payer: Self-pay | Admitting: Podiatry

## 2015-10-27 ENCOUNTER — Ambulatory Visit (INDEPENDENT_AMBULATORY_CARE_PROVIDER_SITE_OTHER): Payer: 59 | Admitting: Podiatry

## 2015-10-27 ENCOUNTER — Ambulatory Visit (INDEPENDENT_AMBULATORY_CARE_PROVIDER_SITE_OTHER): Payer: 59

## 2015-10-27 DIAGNOSIS — M79671 Pain in right foot: Secondary | ICD-10-CM | POA: Diagnosis not present

## 2015-10-27 DIAGNOSIS — M216X9 Other acquired deformities of unspecified foot: Secondary | ICD-10-CM | POA: Diagnosis not present

## 2015-10-27 DIAGNOSIS — M779 Enthesopathy, unspecified: Secondary | ICD-10-CM

## 2015-10-27 MED ORDER — TRIAMCINOLONE ACETONIDE 10 MG/ML IJ SUSP
10.0000 mg | Freq: Once | INTRAMUSCULAR | Status: AC
  Administered 2015-10-27: 10 mg

## 2015-10-27 MED FILL — SERTRALINE HCL 100 MG TAB: 100 | 30 days supply | Qty: 30 | Fill #1

## 2015-10-27 NOTE — Progress Notes (Signed)
Subjective:     Patient ID: Robert Walsh, male   DOB: [birth year], 35 y.o.   MRN: [id number]  HPI patient presents stating I developed a lot of pain underneath my right foot and I tried orthotics from the [facility] which seem to make it worse. Patient states also there is been some pain in the Achilles tendon but that seems moderate at this time and this pain in the forefoot has been present for around one month   Review of Systems     Objective:   Physical Exam Neurovascular status intact muscle strength adequate with inflammation around the side of the sesamoidal complex right mostly within the tibial sesamoid with inflammation and fluid when palpated. Patient is noted to have good digital perfusion currently and mild discomfort in the Achilles tendon with no erythema felt    Assessment:     Probability for sesamoiditis right with inflammatory cavus foot type and mild Achilles tendinitis right over left with equinus condition    Plan:     H&P and condition reviewed with patient. Long-term I do think he would be best and orthotics in a focus on this inflamed area and today I injected the plantar capsule 3 mg Kenalog 5 mill grams Xylocaine underneath the first MPJ. I dispensed and wrapped a dancer's pad with LowDye taping to keep pressure off this area and patient will be seen back in the next 2 weeks  X-ray report indicated probable bipartite sesamoid which I don't believe is acute fracture

## 2015-11-01 ENCOUNTER — Encounter: Payer: Self-pay | Admitting: Podiatry

## 2015-11-01 ENCOUNTER — Ambulatory Visit (INDEPENDENT_AMBULATORY_CARE_PROVIDER_SITE_OTHER): Payer: 59 | Admitting: Podiatry

## 2015-11-01 VITALS — BP 146/85 | HR 81 | Resp 16

## 2015-11-01 DIAGNOSIS — M779 Enthesopathy, unspecified: Secondary | ICD-10-CM

## 2015-11-01 DIAGNOSIS — M79671 Pain in right foot: Secondary | ICD-10-CM | POA: Diagnosis not present

## 2015-11-02 NOTE — Progress Notes (Signed)
Subjective:     Patient ID: Robert Walsh, male   DOB: 08-Jun-1981, 35 y.o.   MRN: [id number]  HPI patient states my right foot feels improved from where it was before but it is still painful if I walk on it too much and I still gets some trouble with the back of my heels   Review of Systems     Objective:   Physical Exam Neurovascular status intact muscle strength adequate with continued discomfort around the first metatarsal head right with inflammation but significant improvement from previous visit and Achilles tendinitis still noted right over left    Assessment:      Plantar flex metatarsal with inflammatory capsulitis sesamoiditis with no current indications of fracture even though it cannot be completely ruled out and Achilles tendinitis    Plan:     H&P condition reviewed with patient. At this point patient is scanned for custom orthotics to reduce stress against the first MPJ and I went ahead and put heel lifts and in order to elevate the plantar posterior heel to try to take pressure off the Achilles tendon. Will be seen back when they're returned or earlier if any issues should occur

## 2015-11-10 DIAGNOSIS — M79602 Pain in left arm: Secondary | ICD-10-CM | POA: Diagnosis not present

## 2015-11-10 DIAGNOSIS — M2550 Pain in unspecified joint: Secondary | ICD-10-CM | POA: Diagnosis not present

## 2015-11-10 DIAGNOSIS — M7660 Achilles tendinitis, unspecified leg: Secondary | ICD-10-CM | POA: Diagnosis not present

## 2015-11-23 ENCOUNTER — Ambulatory Visit: Payer: 59 | Admitting: *Deleted

## 2015-11-23 DIAGNOSIS — M79671 Pain in right foot: Secondary | ICD-10-CM

## 2015-11-23 NOTE — Progress Notes (Signed)
Patient ID: Robert Walsh, male   DOB: Jun 26, 1981, 35 y.o.   MRN: [id number] Patient presents for orthotic pick up.  When dispensing the orthotic it was found the the Left orthotic was missing and another patients orthotics was included.  Contacted manufacturer to correct the error immediately will have patient back at his convenience to pick up.

## 2015-11-23 NOTE — Patient Instructions (Signed)

## 2015-11-25 ENCOUNTER — Ambulatory Visit: Payer: 59 | Admitting: *Deleted

## 2015-11-25 DIAGNOSIS — M79673 Pain in unspecified foot: Secondary | ICD-10-CM

## 2015-11-25 NOTE — Progress Notes (Signed)
Patient ID: Robert Walsh, male   DOB: May 31, 1981, 35 y.o.   MRN: [id number] Patient presents for orthotic pick up.  Verbal and written break in and wear instructions given.  Patient will follow up in 4 weeks if symptoms worsen or fail to improve.

## 2015-11-25 NOTE — Patient Instructions (Signed)

## 2015-12-10 DIAGNOSIS — M9903 Segmental and somatic dysfunction of lumbar region: Secondary | ICD-10-CM | POA: Diagnosis not present

## 2015-12-10 DIAGNOSIS — M9902 Segmental and somatic dysfunction of thoracic region: Secondary | ICD-10-CM | POA: Diagnosis not present

## 2015-12-10 DIAGNOSIS — M545 Low back pain: Secondary | ICD-10-CM | POA: Diagnosis not present

## 2015-12-10 DIAGNOSIS — M9905 Segmental and somatic dysfunction of pelvic region: Secondary | ICD-10-CM | POA: Diagnosis not present

## 2015-12-24 ENCOUNTER — Encounter: Payer: Self-pay | Admitting: Family Medicine

## 2015-12-24 ENCOUNTER — Ambulatory Visit (INDEPENDENT_AMBULATORY_CARE_PROVIDER_SITE_OTHER): Payer: 59 | Admitting: Family Medicine

## 2015-12-24 VITALS — BP 122/80 | Temp 98.3°F | Ht 69.0 in | Wt 180.1 lb

## 2015-12-24 DIAGNOSIS — M4716 Other spondylosis with myelopathy, lumbar region: Secondary | ICD-10-CM

## 2015-12-24 MED ORDER — PREDNISONE 20 MG PO TABS: ORAL_TABLET | ORAL | Status: DC

## 2015-12-24 MED ORDER — IBUPROFEN 800 MG PO TABS: 800.0000 mg | ORAL_TABLET | Freq: Three times a day (TID) | ORAL | Status: DC | PRN

## 2015-12-24 MED ORDER — HYDROCODONE-ACETAMINOPHEN 5-325 MG PO TABS: 1.0000 | ORAL_TABLET | Freq: Four times a day (QID) | ORAL | Status: DC | PRN

## 2015-12-24 NOTE — Progress Notes (Signed)
   Subjective:    Patient ID: Robert Walsh, male    DOB: 1981/02/17, 35 y.o.   MRN: [id number]  Back Pain This is a new problem. The current episode started more than 1 month ago. The problem occurs constantly. The problem is unchanged. The pain is present in the lumbar spine. The quality of the pain is described as aching. The pain is at a severity of 10/10. The pain is severe. The pain is the same all the time. Stiffness is present all day. He has tried NSAIDs, ice and heat (tens unit) for the symptoms. The treatment provided no relief.   Patient states that he has no other concerns at this time.  [facility]ebast[facility] chiro This patient states he's had chiropractic done he also states he had x-rays he was told he had spondylosis he states he would just like a medical opinion he has tried anti-inflammatories this really has not helped. He states the pain is severe especially over the past 5-6 days.  Review of Systems  Musculoskeletal: Positive for back pain.  Pain radiates into the right leg into the thigh region. Denies loss of bowel or bladder control.     Objective:   Physical Exam  This patient has good range of motion with rotation and flexion backwards has more difficult time flexing forward he does have fair straight leg raise does not cause pain but he has tight hamstrings lungs are clear hearts regular subjective discomfort lower back and lower right side      Assessment & Plan:  This gentleman is had 6 months of lower back pain worse over the past 5-6 days causing some sciatica symptoms into the right leg patient does need to have some x-rays completed I also recommended because of the chronic nature this not responding to anti-inflammatories as well as stretching exercises that he be seen by orthopedics specialist.  We will go ahead and allow for the patient uses pain medication as needed over the next few days caution drowsiness home use only not for long-term use also prednisone taper  over the course of the next 9 days is recommended plus also x-rays.

## 2015-12-27 ENCOUNTER — Encounter: Payer: Self-pay | Admitting: Family Medicine

## 2016-01-21 DIAGNOSIS — M545 Low back pain: Secondary | ICD-10-CM | POA: Diagnosis not present

## 2016-02-01 ENCOUNTER — Other Ambulatory Visit (HOSPITAL_COMMUNITY): Payer: Self-pay | Admitting: Specialist

## 2016-02-01 ENCOUNTER — Other Ambulatory Visit: Payer: Self-pay | Admitting: Specialist

## 2016-02-01 DIAGNOSIS — M545 Low back pain: Secondary | ICD-10-CM

## 2016-02-04 MED FILL — SERTRALINE HCL 100 MG TAB: 100 | 30 days supply | Qty: 30 | Fill #2

## 2016-02-07 ENCOUNTER — Ambulatory Visit (HOSPITAL_COMMUNITY): Payer: 59

## 2016-02-17 DIAGNOSIS — M545 Low back pain: Secondary | ICD-10-CM | POA: Diagnosis not present

## 2016-02-25 DIAGNOSIS — M5431 Sciatica, right side: Secondary | ICD-10-CM | POA: Diagnosis not present

## 2016-02-25 DIAGNOSIS — M4306 Spondylolysis, lumbar region: Secondary | ICD-10-CM | POA: Diagnosis not present

## 2016-02-25 DIAGNOSIS — M545 Low back pain: Secondary | ICD-10-CM | POA: Diagnosis not present

## 2016-02-25 MED FILL — HYDROCODON-APAP 7.5-325: 7.5-325 | 3 days supply | Qty: 20 | Fill #0

## 2016-02-25 MED FILL — CHLORHEXIDINE 0.12% RINSE: 0.12 | 16 days supply | Qty: 473 | Fill #0

## 2016-02-25 MED FILL — IBUPROFEN 600 MG TABLET: 600 | 6 days supply | Qty: 18 | Fill #0

## 2016-03-09 ENCOUNTER — Ambulatory Visit (INDEPENDENT_AMBULATORY_CARE_PROVIDER_SITE_OTHER): Payer: 59 | Admitting: Family Medicine

## 2016-03-09 ENCOUNTER — Encounter: Payer: Self-pay | Admitting: Family Medicine

## 2016-03-09 VITALS — BP 122/78 | Ht 69.0 in | Wt 178.6 lb

## 2016-03-09 DIAGNOSIS — F411 Generalized anxiety disorder: Secondary | ICD-10-CM

## 2016-03-09 DIAGNOSIS — F410 Panic disorder [episodic paroxysmal anxiety] without agoraphobia: Secondary | ICD-10-CM | POA: Diagnosis not present

## 2016-03-09 DIAGNOSIS — F429 Obsessive-compulsive disorder, unspecified: Secondary | ICD-10-CM

## 2016-03-09 DIAGNOSIS — G4700 Insomnia, unspecified: Secondary | ICD-10-CM

## 2016-03-09 MED ORDER — SERTRALINE HCL 100 MG PO TABS: 100.0000 mg | ORAL_TABLET | Freq: Every day | ORAL | 5 refills | Status: DC

## 2016-03-09 MED ORDER — CLONAZEPAM 1 MG PO TBDP: ORAL_TABLET | ORAL | 2 refills | Status: DC

## 2016-03-09 MED FILL — SERTRALINE HCL 100 MG TAB: 100 | 30 days supply | Qty: 30 | Fill #0

## 2016-03-09 NOTE — Progress Notes (Signed)
   Subjective:    Patient ID: Robert Walsh, male    DOB: 17-May-1981, 35 y.o.   MRN: [id number]  HPI Patient arrives for a follow up on OCD. Patient currently on Zoloft.  Pt ntes ocd fairly stale but more anxiety past six months  Notes more phys symt  Looking at possible   Felt rapid heart rate, noted incr s o b, felt out of it, Hit hard at two a m,   Ome sleep issues witht rtroublre sleeping  Patient having a lot of difficulty getting to sleep. Working third shift. Swing shift status at times which makes it difficult. Next  More difficulties with rapid heart rate. And shortness of breath. Generally occur during periods of high stress. And anxiety. Under a lot of stress with father's illness with rapid ALS  Back pain ongoing somewhat better  xrays showed no sig spondyulosis  Mri showed  fx  Sports fx  Now working onstretching exercises    Review of Systems No headache, no major weight loss or weight gain, no chest pain no back pain abdominal pain no change in bowel habits complete ROS otherwise negative     Objective:   Physical Exam  Alert vitals stable, NAD. Blood pressure good on repeat. HEENT normal. Lungs clear. Heart regular rate and rhythm.       Assessment & Plan:  Impression #1 OCD fairly stable discussed will maintain same dose of Zoloft No. 2 insomnia challenge with night shifts. Patient to consider using when necessary clonazepam No. 3 intermittent true panic attacks. Associated anxiety. Comorbidity associated #1 plan add clonazepam diet exercise discussed. Maintain Zoloft same dose rationale discussed, 25 minutes spent easily most in discussion WSL

## 2016-03-21 DIAGNOSIS — M2550 Pain in unspecified joint: Secondary | ICD-10-CM | POA: Diagnosis not present

## 2016-03-21 DIAGNOSIS — M79602 Pain in left arm: Secondary | ICD-10-CM | POA: Diagnosis not present

## 2016-03-21 DIAGNOSIS — M67911 Unspecified disorder of synovium and tendon, right shoulder: Secondary | ICD-10-CM | POA: Diagnosis not present

## 2016-03-21 DIAGNOSIS — M7660 Achilles tendinitis, unspecified leg: Secondary | ICD-10-CM | POA: Diagnosis not present

## 2016-03-28 MED FILL — clonazePAM 1 MG TBDP: 1 | 20 days supply | Qty: 20 | Fill #0

## 2016-03-31 ENCOUNTER — Telehealth: Payer: Self-pay | Admitting: Family Medicine

## 2016-03-31 DIAGNOSIS — Z139 Encounter for screening, unspecified: Secondary | ICD-10-CM

## 2016-03-31 NOTE — Telephone Encounter (Signed)
Spoke with patient and informed him per Dr.Steve [place], and Met 7 were ordered for upcoming physical. Advised patient to be fasting prior to having labs drawn. Patient verbalized understanding.

## 2016-03-31 NOTE — Addendum Note (Signed)
Addended by: Jeralene PetersREWS, SHANNON R on: 03/31/2016 02:59 PM   Modules accepted: Orders

## 2016-03-31 NOTE — Telephone Encounter (Signed)
Lip liv m7 

## 2016-03-31 NOTE — Telephone Encounter (Signed)
Pt is requesting lab orders to be sent over for an upcoming wellness visit. There are no recent labs in epic.

## 2016-04-03 DIAGNOSIS — M545 Low back pain: Secondary | ICD-10-CM | POA: Diagnosis not present

## 2016-04-03 DIAGNOSIS — M5431 Sciatica, right side: Secondary | ICD-10-CM | POA: Diagnosis not present

## 2016-04-03 DIAGNOSIS — M160 Bilateral primary osteoarthritis of hip: Secondary | ICD-10-CM | POA: Diagnosis not present

## 2016-04-03 DIAGNOSIS — M4306 Spondylolysis, lumbar region: Secondary | ICD-10-CM | POA: Diagnosis not present

## 2016-04-12 DIAGNOSIS — M1612 Unilateral primary osteoarthritis, left hip: Secondary | ICD-10-CM | POA: Diagnosis not present

## 2016-04-14 MED FILL — SERTRALINE HCL 100 MG TAB: 100 | 30 days supply | Qty: 30 | Fill #1

## 2016-04-18 ENCOUNTER — Encounter: Payer: 59 | Admitting: Family Medicine

## 2016-04-27 ENCOUNTER — Encounter: Payer: 59 | Admitting: Family Medicine

## 2016-05-01 DIAGNOSIS — S3981XA Other specified injuries of abdomen, initial encounter: Secondary | ICD-10-CM | POA: Diagnosis not present

## 2016-05-26 MED FILL — SERTRALINE HCL 100 MG TAB: 100 | 30 days supply | Qty: 30 | Fill #2

## 2016-07-04 MED FILL — SERTRALINE HCL 100 MG TAB: 100 | 30 days supply | Qty: 30 | Fill #3

## 2016-07-05 DIAGNOSIS — M67921 Unspecified disorder of synovium and tendon, right upper arm: Secondary | ICD-10-CM | POA: Diagnosis not present

## 2016-07-05 DIAGNOSIS — M7660 Achilles tendinitis, unspecified leg: Secondary | ICD-10-CM | POA: Diagnosis not present

## 2016-07-05 DIAGNOSIS — M79602 Pain in left arm: Secondary | ICD-10-CM | POA: Diagnosis not present

## 2016-07-05 DIAGNOSIS — M2550 Pain in unspecified joint: Secondary | ICD-10-CM | POA: Diagnosis not present

## 2016-07-05 DIAGNOSIS — Z6829 Body mass index (BMI) 29.0-29.9, adult: Secondary | ICD-10-CM | POA: Diagnosis not present

## 2016-07-05 DIAGNOSIS — E663 Overweight: Secondary | ICD-10-CM | POA: Diagnosis not present

## 2016-08-04 MED FILL — SERTRALINE HCL 100 MG TAB: 100 | 30 days supply | Qty: 30 | Fill #4

## 2016-08-22 ENCOUNTER — Encounter: Payer: Self-pay | Admitting: Family Medicine

## 2016-08-22 ENCOUNTER — Ambulatory Visit (INDEPENDENT_AMBULATORY_CARE_PROVIDER_SITE_OTHER): Payer: 59 | Admitting: Family Medicine

## 2016-08-22 VITALS — BP 144/90 | Ht 69.0 in | Wt 195.0 lb

## 2016-08-22 DIAGNOSIS — F429 Obsessive-compulsive disorder, unspecified: Secondary | ICD-10-CM

## 2016-08-22 MED ORDER — SERTRALINE HCL 100 MG PO TABS: 100.0000 mg | ORAL_TABLET | Freq: Every day | ORAL | 5 refills | Status: DC

## 2016-08-22 NOTE — Progress Notes (Signed)
   Subjective:    Patient ID: Robert Walsh, male    DOB: [birth year], 36 y.o.   MRN: [id number]  HPIMed check up. Takes zoloft. Pt states he never filled klonopin. No problems or concerns.   Patient has recent additional emotional strain with his father passing away from ALS. States that he is handling this reasonably well considering the circumstances.  States he did not take the Klonopin. States Zoloft definitely helps his symptoms and he wants to stay on it. No obvious side effects. Compliant.  Review of Systems No headache, no major weight loss or weight gain, no chest pain no back pain abdominal pain no change in bowel habits complete ROS otherwise negative     Objective:   Physical Exam  Alert vitals stable, NAD. Blood pressure good on repeat. HEENT normal. Lungs clear. Heart regular rate and rhythm.       Assessment & Plan:  Impression 1 OCD clinically stable. Compliant with medications. Compliance discussed. Exercise also encouraged WSL

## 2016-09-06 MED FILL — SERTRALINE HCL 100 MG TAB: 100 | 30 days supply | Qty: 30 | Fill #5

## 2016-10-13 MED FILL — SERTRALINE HCL 100 MG TAB: 100 | 30 days supply | Qty: 30 | Fill #0

## 2016-10-25 ENCOUNTER — Other Ambulatory Visit: Payer: Self-pay

## 2016-10-25 NOTE — Progress Notes (Signed)
May ref times two

## 2016-10-26 ENCOUNTER — Other Ambulatory Visit: Payer: Self-pay | Admitting: *Deleted

## 2016-10-26 MED ORDER — CLONAZEPAM 1 MG PO TBDP: ORAL_TABLET | ORAL | 1 refills | Status: DC

## 2016-10-26 NOTE — Progress Notes (Signed)
done

## 2016-11-07 ENCOUNTER — Encounter: Payer: Self-pay | Admitting: Podiatry

## 2016-11-07 ENCOUNTER — Ambulatory Visit (INDEPENDENT_AMBULATORY_CARE_PROVIDER_SITE_OTHER): Payer: 59 | Admitting: Podiatry

## 2016-11-07 DIAGNOSIS — M216X9 Other acquired deformities of unspecified foot: Secondary | ICD-10-CM

## 2016-11-07 DIAGNOSIS — M79671 Pain in right foot: Secondary | ICD-10-CM

## 2016-11-10 ENCOUNTER — Encounter: Payer: 59 | Admitting: Family Medicine

## 2016-11-13 ENCOUNTER — Encounter: Payer: Self-pay | Admitting: Family Medicine

## 2016-11-13 ENCOUNTER — Ambulatory Visit (INDEPENDENT_AMBULATORY_CARE_PROVIDER_SITE_OTHER): Payer: 59 | Admitting: Family Medicine

## 2016-11-13 VITALS — BP 136/80 | Ht 70.25 in | Wt 195.0 lb

## 2016-11-13 DIAGNOSIS — R350 Frequency of micturition: Secondary | ICD-10-CM | POA: Diagnosis not present

## 2016-11-13 DIAGNOSIS — F429 Obsessive-compulsive disorder, unspecified: Secondary | ICD-10-CM

## 2016-11-13 DIAGNOSIS — Z0000 Encounter for general adult medical examination without abnormal findings: Secondary | ICD-10-CM

## 2016-11-13 LAB — POCT URINALYSIS DIPSTICK
Protein, UA: 30
Spec Grav, UA: <1.003 — AB (ref 1.030–1.035)
pH, UA: 8 (ref 5.0–8.0)

## 2016-11-13 MED ORDER — CLONAZEPAM 1 MG PO TBDP: ORAL_TABLET | ORAL | 2 refills | Status: DC

## 2016-11-13 MED ORDER — SERTRALINE HCL 100 MG PO TABS: 100.0000 mg | ORAL_TABLET | Freq: Every day | ORAL | 5 refills | Status: DC

## 2016-11-13 MED FILL — SERTRALINE HCL 100 MG TAB: 100 | 30 days supply | Qty: 30 | Fill #1

## 2016-11-13 MED FILL — clonazePAM 1 MG TBDP: 1 | 10 days supply | Qty: 20 | Fill #0

## 2016-11-13 NOTE — Progress Notes (Signed)
   Subjective:    Patient ID: Robert Walsh, male    DOB: [birth year], 36 y.o.   MRN: [id number]  HPI The patient comes in today for a wellness visit.    A review of their health history was completed.  A review of medications was also completed.  Any needed refills; clonazepam  Eating habits: health conscious  Falls/  MVA accidents in past few months: none  Regular exercise: resistance training, cardio, basketball  Specialist pt sees on regular basis: none  Preventative health issues were discussed.   Additional concerns: frequent urination, high ldl,dl 829, DID NOT FAST   high bp  bp numbers up lately  Clonazepam uses prn, needs for emergencies  Pt has hernia,  Overall in the groin, surg rec mesh, if necessary,  Watching dier tfairly closely. Did mess around with hthe ketgenic diet for a few weeks, not any longer     Review of Systems  Constitutional: Negative for activity change, appetite change and fever.  HENT: Negative for congestion and rhinorrhea.   Eyes: Negative for discharge.  Respiratory: Negative for cough and wheezing.   Cardiovascular: Negative for chest pain.  Gastrointestinal: Negative for abdominal pain, blood in stool and vomiting.  Genitourinary: Negative for difficulty urinating and frequency.  Musculoskeletal: Negative for neck pain.  Skin: Negative for rash.  Allergic/Immunologic: Negative for environmental allergies and food allergies.  Neurological: Negative for weakness and headaches.  Psychiatric/Behavioral: Negative for agitation.       Objective:   Physical Exam  Constitutional: He appears well-developed and well-nourished.  HENT:  Head: Normocephalic and atraumatic.  Right Ear: External ear normal.  Left Ear: External ear normal.  Nose: Nose normal.  Mouth/Throat: Oropharynx is clear and moist.  Eyes: EOM are normal. Pupils are equal, round, and reactive to light.  Neck: Normal range of motion. Neck supple. No thyromegaly  present.  Cardiovascular: Normal rate, regular rhythm and normal heart sounds.   No murmur heard. Pulmonary/Chest: Effort normal and breath sounds normal. No respiratory distress. He has no wheezes.  Abdominal: Soft. Bowel sounds are normal. He exhibits no distension and no mass. There is no tenderness.  Genitourinary: Penis normal.  Musculoskeletal: Normal range of motion. He exhibits no edema.  Lymphadenopathy:    He has no cervical adenopathy.  Neurological: He is alert. He exhibits normal muscle tone.  Skin: Skin is warm and dry. No erythema.  Psychiatric: He has a normal mood and affect. His behavior is normal. Judgment normal.          Assessment & Plan:  Impression well Adult exam #2 chronic anxiety with history of OCD. Stable on medication #3 borderline elevation of blood pressure. Not enough for medications yet discussed #4 elevated LDL versus with recent blood work plan repeat lipid profile diet exercise discussed. Minimal time actually spent on problems mostly by far wellness exam

## 2016-11-13 NOTE — Progress Notes (Signed)
Subjective:     Patient ID: Robert Walsh, male   DOB: 09-Apr-1981, 36 y.o.   MRN: [id number]  HPI patient presents stating that the right foot is giving moderate discomfort that improved from previous with orthotics which are starting to wear out   Review of Systems     Objective:   Physical Exam Neurovascular status intact with patient found to have inflammation pain around the sesamoidal complex right that's improved but still painful when pressed    Assessment:     Inflammatory changes consistent with sesamoiditis of a moderate nature with orthotics which are no longer holding to the proper insertion    Plan:     Reviewed condition and recommended at one point changing the orthotic he has and at this point I did go ahead and I scanned for new orthotic along with pad or this to reduce the pressure that's present

## 2016-11-14 ENCOUNTER — Telehealth: Payer: Self-pay | Admitting: Family Medicine

## 2016-11-14 NOTE — Telephone Encounter (Signed)
Pt dropped off lab results. Please see blue folder in office.

## 2016-11-23 DIAGNOSIS — M2550 Pain in unspecified joint: Secondary | ICD-10-CM | POA: Diagnosis not present

## 2016-11-23 DIAGNOSIS — M67921 Unspecified disorder of synovium and tendon, right upper arm: Secondary | ICD-10-CM | POA: Diagnosis not present

## 2016-11-23 DIAGNOSIS — E663 Overweight: Secondary | ICD-10-CM | POA: Diagnosis not present

## 2016-11-23 DIAGNOSIS — M7660 Achilles tendinitis, unspecified leg: Secondary | ICD-10-CM | POA: Diagnosis not present

## 2016-11-23 DIAGNOSIS — M79601 Pain in right arm: Secondary | ICD-10-CM | POA: Diagnosis not present

## 2016-11-23 DIAGNOSIS — Z6829 Body mass index (BMI) 29.0-29.9, adult: Secondary | ICD-10-CM | POA: Diagnosis not present

## 2016-11-28 ENCOUNTER — Ambulatory Visit: Payer: 59 | Attending: Physician Assistant | Admitting: Physical Therapy

## 2016-11-28 ENCOUNTER — Encounter: Payer: Self-pay | Admitting: Physical Therapy

## 2016-11-28 DIAGNOSIS — M25521 Pain in right elbow: Secondary | ICD-10-CM | POA: Insufficient documentation

## 2016-11-28 NOTE — Therapy (Addendum)
Robert Walsh, [place], [address] Phone: [phone]   Fax:  [phone]  Physical Therapy Evaluation/Discharge summary  Patient Details  Name: Robert Walsh MRN: [id number] Date of Birth: 10/16/1980 Referring Provider: Clance Boll, DO  Encounter Date: 11/28/2016      PT End of Session - 11/28/16 0932    Visit Number 1   Number of Visits 9   Date for PT Re-Evaluation 12/29/16   Authorization Type [facility]- no visit limit   PT Start Time 0932   PT Stop Time 1014   PT Time Calculation (min) 42 min   Activity Tolerance Patient tolerated treatment well   Behavior During Therapy [facility] for tasks assessed/performed      Past Medical History:  Diagnosis Date  . Depression   . Insomnia   . Obsessive compulsive disorder     History reviewed. No pertinent surgical history.  There were no vitals filed for this visit.       Subjective Assessment - 11/28/16 0934    Subjective No single event noted to injure arm. Pain has been notable for about a year. Last week pain was really bad. Found the tear in an ultrasound. N/T when falling asleep with elbow bent. was prescribed anti-inflammatories but did not fill the RX.    Currently in Pain? No/denies   Pain Score 0-No pain   Pain Location Elbow   Pain Orientation Right;Lateral   Aggravating Factors  elbow extension (at end range), playing basketball-shooting, sleeping with elbow flexed   Pain Relieving Factors avoiding aggrivating motions            [facility] PT Assessment - 11/28/16 0001      Assessment   Medical Diagnosis R triceps tear   Referring Provider Clance Boll, DO   Hand Dominance Right   Prior Therapy no     Precautions   Precautions None     Restrictions   Weight Bearing Restrictions No     Balance Screen   Has the patient fallen in the past 6 months No     [place] residence     Prior Function   Level of Independence Independent     Cognition   Overall Cognitive Status Within Functional Limits for tasks assessed     Observation/Other Assessments   Focus on Therapeutic Outcomes (FOTO)  58% ability (goal 71% ability)     Sensation   Additional Comments [facility]     Posture/Postural Control   Posture Comments able to obtain upright posture sits with very slouched and forward head posture     ROM / Strength   AROM / PROM / Strength Strength     Strength   Overall Strength Comments WFL, no pain   Strength Assessment Site Hand   Right/Left hand Right;Left   Right Hand Grip (lbs) 120   Left Hand Grip (lbs) 115     Palpation   Palpation comment concordant pain upon palpation at R brachialis. taught bands notable, denied triceps                   [facility] Adult PT Treatment/Exercise - 11/28/16 0001      Exercises   Exercises Shoulder;Elbow     Modalities   Modalities Moist Heat     Moist Heat Therapy   Number Minutes Moist Heat 10 Minutes   Moist Heat Location Elbow  R-brachialis & triceps  Manual Therapy   Manual Therapy Soft tissue mobilization;Myofascial release   Soft tissue mobilization IASTM and stretching of brachialis   Myofascial Release brachialis                PT Education - 11/28/16 2050    Education provided Yes   Education Details anatomy of condition, POC, HEP, exercise form/rationale, posture effects of arm, CKC and OCK challenges for strengthening at gym   Person(s) Educated Patient   Methods Explanation;Demonstration;Tactile cues;Verbal cues   Comprehension Verbalized understanding;Returned demonstration;Verbal cues required;Tactile cues required;Need further instruction          PT Short Term Goals - 11/28/16 2056      PT SHORT TERM GOAL #1   Title FOTO to 71% ability to indicate significant improvement in functional ability by 5/25   Baseline 58% ability at eval   Time 4   Period Weeks   Status New     PT SHORT  TERM GOAL #2   Title pt will be able to return to PLOF and play basketball without elbow pain   Baseline pain at end range extension while shooting ball   Time 4   Period Weeks   Status New     PT SHORT TERM GOAL #3   Title Pt will not experience any pain at elbow at end range elbow extension   Baseline pain at end range at eval   Time 4   Period Weeks   Status New     PT SHORT TERM GOAL #4   Title Pt will verbalize understanding and demo proper CKC and OCK triceps challenges to continue strenghening at eval    Baseline will educate and progress as appropriate   Time 4   Period Weeks   Status New                  Plan - 11/28/16 1009    Clinical Impression Statement Pt presents to PT with complaints of R lateral elbow pain upon end range extension or sustained flexion. Denies limitation in lifting. Concordant pain found in trigger points and taught band of R brachialis. pt reported decrease in symptoms following manual treatment today. Pt will benefit from skilled PT to decrease pain in elbow and evaluate workouts and make corrections as needed to decrease future risk of injury.    Rehab Potential Good   PT Frequency 2x / week   PT Duration 4 weeks   PT Treatment/Interventions ADLs/Self Care Home Management;Cryotherapy;Electrical Stimulation;Iontophoresis '4mg'$ /ml Dexamethasone;Functional mobility training;Ultrasound;Traction;Moist Heat;Therapeutic activities;Therapeutic exercise;Neuromuscular re-education;Patient/family education;Passive range of motion;Manual techniques;Dry needling;Taping   PT Next Visit Plan DN brachialis, brachioradialis, tricepts; CKC & eccentric challenges triceps, brachialis; prone periscapular endurance    PT Home Exercise Plan upright posture   Consulted and Agree with Plan of Care Patient      Patient will benefit from skilled therapeutic intervention in order to improve the following deficits and impairments:  Pain, Postural dysfunction,  Decreased activity tolerance  Visit Diagnosis: Pain in right elbow - Plan: PT plan of care cert/re-cert     Problem List Patient Active Problem List   Diagnosis Date Noted  . Lumbar spondylosis with myelopathy 12/24/2015  . Other malaise and fatigue 02/12/2014  . OCD (obsessive compulsive disorder) 03/02/2013  . Insomnia 03/02/2013    [name] C. Deshay Blumenfeld PT, DPT 11/28/16 9:02 PM   [facility] [address] [place], [place], [address] Phone: [phone]   Fax:  [phone]  Name: Alyssa Mancera  [place] MRN: [id number] Date of Birth: 1980/11/10  PHYSICAL THERAPY DISCHARGE SUMMARY  Visits from Start of Care: 1  Current functional level related to goals / functional outcomes: See above   Remaining deficits: See above   Education / Equipment: Anatomy of condition, POC, HEP, exercise form/rationale  Plan: Patient agrees to discharge.  Patient goals were not met. Patient is being discharged due to not returning since the last visit.  ?????     Jermar Colter C. Sapna Padron PT, DPT 01/10/17 12:55 PM

## 2016-11-30 ENCOUNTER — Other Ambulatory Visit: Payer: 59

## 2016-11-30 ENCOUNTER — Ambulatory Visit: Payer: 59 | Admitting: Physical Therapy

## 2016-12-07 ENCOUNTER — Ambulatory Visit: Payer: 59 | Admitting: Physical Therapy

## 2016-12-11 DIAGNOSIS — Z0000 Encounter for general adult medical examination without abnormal findings: Secondary | ICD-10-CM | POA: Diagnosis not present

## 2016-12-12 DIAGNOSIS — M722 Plantar fascial fibromatosis: Secondary | ICD-10-CM

## 2016-12-12 LAB — LIPID PANEL
Chol/HDL Ratio: 3.7 ratio (ref 0.0–5.0)
Cholesterol, Total: 223 mg/dL — ABNORMAL HIGH (ref 100–199)
HDL: 61 mg/dL (ref >39)
LDL Calculated: 147 mg/dL — ABNORMAL HIGH (ref 0–99)
Triglycerides: 76 mg/dL (ref 0–149)
VLDL Cholesterol Cal: 15 mg/dL (ref 5–40)

## 2016-12-17 ENCOUNTER — Encounter: Payer: Self-pay | Admitting: Family Medicine

## 2016-12-20 ENCOUNTER — Other Ambulatory Visit: Payer: 59

## 2016-12-28 MED FILL — SERTRALINE HCL 100 MG TAB: 100 | 30 days supply | Qty: 30 | Fill #2

## 2017-01-30 MED FILL — SERTRALINE HCL 100 MG TAB: 100 | 30 days supply | Qty: 30 | Fill #3

## 2017-02-20 ENCOUNTER — Ambulatory Visit: Payer: 59 | Admitting: Family Medicine

## 2017-02-22 ENCOUNTER — Ambulatory Visit: Payer: 59 | Admitting: Nurse Practitioner

## 2017-02-23 MED FILL — HYDROCODON-APAP 5-325: 5-325 | 2 days supply | Qty: 10 | Fill #0

## 2017-02-23 MED FILL — SERTRALINE HCL 100 MG TAB: 100 | 30 days supply | Qty: 30 | Fill #4

## 2017-04-03 ENCOUNTER — Ambulatory Visit (INDEPENDENT_AMBULATORY_CARE_PROVIDER_SITE_OTHER): Payer: 59 | Admitting: Family Medicine

## 2017-04-03 ENCOUNTER — Encounter: Payer: Self-pay | Admitting: Family Medicine

## 2017-04-03 VITALS — BP 132/88 | Temp 98.6°F | Ht 70.0 in | Wt 193.0 lb

## 2017-04-03 DIAGNOSIS — I889 Nonspecific lymphadenitis, unspecified: Secondary | ICD-10-CM

## 2017-04-03 DIAGNOSIS — J029 Acute pharyngitis, unspecified: Secondary | ICD-10-CM

## 2017-04-03 LAB — POCT RAPID STREP A (OFFICE): Rapid Strep A Screen: NEGATIVE

## 2017-04-03 MED ORDER — AZITHROMYCIN 250 MG PO TABS: ORAL_TABLET | ORAL | 0 refills | Status: DC

## 2017-04-03 NOTE — Progress Notes (Signed)
   Subjective:    Patient ID: Robert Walsh, male    DOB: 1980-08-25, 36 y.o.   MRN: [id number]  Sinusitis  This is a new problem. The current episode started yesterday. (Sore throat, body aches) Treatments tried: motrin.    Pos sore throat severeAt times pain sharp other times achy. Worse with swallowing, worse in the morning no significant radiation  achey thru out  Now on motri with the achey nes s  No headach  .thius  Results for orders placed or performed in visit on 04/03/17  POCT rapid strep A  Result Value Ref Range   Rapid Strep A Screen Negative Negative       Review of Systems No headache, no major weight loss or weight gain, no chest pain no back pain abdominal pain no change in bowel habits complete ROS otherwise negative     Objective:   Physical Exam Alert vitals stable, NAD. Blood pressure good on repeat. HEENT Inflamed throat plus tender anterior cervical nodes otherwise normal. Lungs clear. Heart regular rate and rhythm.        Assessment & Plan:  Impression severe pharyngitis with cervical lymphadenitis negative strep screen. Will cover for atypical bacteria. Rationale discussed. Symptom care discussed

## 2017-04-04 LAB — STREP A DNA PROBE: Strep Gp A Direct, DNA Probe: NEGATIVE

## 2017-04-11 MED FILL — SERTRALINE HCL 100 MG TAB: 100 | 30 days supply | Qty: 30 | Fill #5

## 2017-05-15 ENCOUNTER — Ambulatory Visit: Payer: 59 | Admitting: Family Medicine

## 2017-12-12 ENCOUNTER — Other Ambulatory Visit: Payer: Self-pay | Admitting: Family Medicine

## 2017-12-13 NOTE — Telephone Encounter (Signed)
Ok plus one mo. Contact pt needs annual wellness plus f u for this

## 2017-12-18 ENCOUNTER — Telehealth: Payer: Self-pay | Admitting: *Deleted

## 2017-12-18 NOTE — Telephone Encounter (Signed)
Patient called requesting sertraline to be refilled. Please advise

## 2017-12-18 NOTE — Telephone Encounter (Signed)
Med was sent in to [facility] on may 9th. Called pharm to make sure they received rx and they did. They have it ready for pickup. Pt notified.

## 2018-02-13 ENCOUNTER — Other Ambulatory Visit: Payer: Self-pay | Admitting: Family Medicine

## 2018-02-28 ENCOUNTER — Ambulatory Visit (INDEPENDENT_AMBULATORY_CARE_PROVIDER_SITE_OTHER): Payer: BLUE CROSS/BLUE SHIELD | Admitting: Family Medicine

## 2018-02-28 ENCOUNTER — Encounter: Payer: Self-pay | Admitting: Family Medicine

## 2018-02-28 VITALS — BP 122/78 | Ht 70.0 in | Wt 193.2 lb

## 2018-02-28 DIAGNOSIS — I10 Essential (primary) hypertension: Secondary | ICD-10-CM | POA: Diagnosis not present

## 2018-02-28 DIAGNOSIS — F429 Obsessive-compulsive disorder, unspecified: Secondary | ICD-10-CM

## 2018-02-28 MED ORDER — SERTRALINE HCL 100 MG PO TABS
ORAL_TABLET | ORAL | 5 refills | Status: DC
Start: 2018-02-28 — End: 2018-10-22

## 2018-02-28 MED ORDER — CLONAZEPAM 1 MG PO TBDP: ORAL_TABLET | ORAL | 1 refills | Status: DC

## 2018-02-28 NOTE — Progress Notes (Signed)
   Subjective:    Patient ID: Robert Walsh, male    DOB: 26-Jul-1981, 37 y.o.   MRN: [id number]  HPI Pt here today for med check.   Pt saw dr Karilyn Cotagosrani, and had elev blood pressure.  Was placed on lisinopril , b p was up 155 over 90 ,    Pt has cut down affeine in the norning    Pt notes ocd flares up at times, sometimes triggered by anxiety or domestic issues   Anxiety and ocd and physical symtoms will kick in with tremulous and shaking and wondering about b p      Review of Systems No headache, no major weight loss or weight gain, no chest pain no back pain abdominal pain no change in bowel habits complete ROS otherwise negative     Objective:   Physical Exam  Alert vitals stable, NAD. Blood pressure good on repeat. HEENT normal. Lungs clear. Heart regular rate and rhythm.       Assessment & Plan:  Good control discussedImpression OCD medications maintain same dose.  Exercise  Essential hypertension new diagnosis.  Patient now on medication.  Discussed  Follow-up every 6 months diet exercise discussed

## 2018-04-19 ENCOUNTER — Encounter: Payer: Self-pay | Admitting: Family Medicine

## 2018-04-19 ENCOUNTER — Ambulatory Visit (INDEPENDENT_AMBULATORY_CARE_PROVIDER_SITE_OTHER): Payer: BLUE CROSS/BLUE SHIELD | Admitting: Family Medicine

## 2018-04-19 VITALS — BP 122/84 | Temp 98.6°F | Ht 70.0 in | Wt 203.0 lb

## 2018-04-19 DIAGNOSIS — R300 Dysuria: Secondary | ICD-10-CM

## 2018-04-19 DIAGNOSIS — R3589 Other polyuria: Secondary | ICD-10-CM

## 2018-04-19 DIAGNOSIS — R358 Other polyuria: Secondary | ICD-10-CM

## 2018-04-19 LAB — POCT URINALYSIS DIPSTICK
Spec Grav, UA: 1.02 (ref 1.010–1.025)
pH, UA: 5 (ref 5.0–8.0)

## 2018-04-19 LAB — POCT GLUCOSE (DEVICE FOR HOME USE): POC Glucose: 134 mg/dl — AB (ref 70–99)

## 2018-04-19 MED ORDER — CIPROFLOXACIN HCL 500 MG PO TABS: 500.0000 mg | ORAL_TABLET | Freq: Two times a day (BID) | ORAL | 0 refills | Status: DC

## 2018-04-19 NOTE — Progress Notes (Signed)
   Subjective:    Patient ID: Robert Walsh, male    DOB: 1980-11-01, 37 y.o.   MRN: [id number]  Dysuria   This is a new problem. Episode onset: 2 days. Associated symptoms include urgency. Pertinent negatives include no chills, nausea or vomiting. Associated symptoms comments: Abdominal pain . Treatments tried: azo.  Very nice patient having significant troubles   He relates urinary frequency relates urinating several times at night relates some dysuria relates lower abdominal discomfort denies back pain has had problems with overactive bladder Results for orders placed or performed in visit on 04/19/18  POCT urinalysis dipstick  Result Value Ref Range   Color, UA     Clarity, UA     Glucose, UA     Bilirubin, UA     Ketones, UA     Spec Grav, UA 1.020 1.010 - 1.025   Blood, UA     pH, UA 5.0 5.0 - 8.0   Protein, UA     Urobilinogen, UA     Nitrite, UA     Leukocytes, UA     Appearance     Odor     .th  Review of Systems  Constitutional: Negative for activity change, chills and fever.  HENT: Negative for congestion, ear pain and rhinorrhea.   Eyes: Negative for discharge.  Respiratory: Negative for cough and wheezing.   Cardiovascular: Negative for chest pain.  Gastrointestinal: Negative for nausea and vomiting.  Genitourinary: Positive for dysuria and urgency.  Musculoskeletal: Negative for arthralgias.       Objective:   Physical Exam  Constitutional: He appears well-developed.  HENT:  Head: Normocephalic.  Mouth/Throat: Oropharynx is clear and moist. No oropharyngeal exudate.  Neck: Normal range of motion.  Cardiovascular: Normal rate, regular rhythm and normal heart sounds.  No murmur heard. Pulmonary/Chest: Effort normal and breath sounds normal. He has no wheezes.  Abdominal: Soft. There is no tenderness. There is no guarding.  Lymphadenopathy:    He has no cervical adenopathy.  Neurological: He exhibits normal muscle tone.  Skin: Skin is warm and dry.   Nursing note and vitals reviewed. Prostate not enlarged but tender per patient        Assessment & Plan:  Prostatitis cipro 500  Bid 21 days Saw palmetto Warnings discussed  Should gradually get better with antibiotics hopefully saw palmetto will help him with urination so he does not have to get up as frequently at nighttime follow-up if ongoing troubles

## 2018-10-22 ENCOUNTER — Other Ambulatory Visit: Payer: Self-pay | Admitting: Family Medicine

## 2018-10-22 NOTE — Telephone Encounter (Signed)
Ok six mo 

## 2018-11-22 ENCOUNTER — Other Ambulatory Visit: Payer: Self-pay | Admitting: Family Medicine

## 2018-11-25 NOTE — Telephone Encounter (Signed)
Pt states that he has not been back to see Dr.Gosrani and was informed by provider to let us know when he wanted [place] to take over refills. Please advise. Thank you

## 2018-11-25 NOTE — Telephone Encounter (Signed)
Needs six mo virt vist scheduled, then can ref

## 2018-11-26 ENCOUNTER — Other Ambulatory Visit: Payer: Self-pay

## 2018-11-26 ENCOUNTER — Ambulatory Visit (INDEPENDENT_AMBULATORY_CARE_PROVIDER_SITE_OTHER): Payer: Self-pay | Admitting: Family Medicine

## 2018-11-26 ENCOUNTER — Encounter: Payer: Self-pay | Admitting: Family Medicine

## 2018-11-26 DIAGNOSIS — I10 Essential (primary) hypertension: Secondary | ICD-10-CM

## 2018-11-26 DIAGNOSIS — F429 Obsessive-compulsive disorder, unspecified: Secondary | ICD-10-CM

## 2018-11-26 MED ORDER — SERTRALINE HCL 100 MG PO TABS: ORAL_TABLET | ORAL | 5 refills | Status: DC

## 2018-11-26 MED ORDER — LISINOPRIL 10 MG PO TABS: 10.0000 mg | ORAL_TABLET | Freq: Every day | ORAL | 5 refills | Status: DC

## 2018-11-26 NOTE — Progress Notes (Signed)
   Subjective:  Audio plus visual  Patient ID: Robert Walsh, male    DOB: [birth year], 38 y.o.   MRN: [id number]  HPI Pt is needing refills on just Lisinopril. Pt states his BP has been doing OK. Pt states he had a urologist appt recently and his BP was 120/85. Pt not having any headaches, blurred vision or any other symptoms.   Virtual Visit via Telephone Note  I connected with Robert Walsh on 11/26/18 at 10:00 AM EDT by telephone and verified that I am speaking with the correct person using two identifiers.   I discussed the limitations, risks, security and privacy concerns of performing an evaluation and management service by telephone and the availability of in person appointments. I also discussed with the patient that there may be a patient responsible charge related to this service. The patient expressed understanding and agreed to proceed.   History of Present Illness:    Observations/Objective:   Assessment and Plan:   Follow Up Instructions:    I discussed the assessment and treatment plan with the patient. The patient was provided an opportunity to ask questions and all were answered. The patient agreed with the plan and demonstrated an understanding of the instructions.   The patient was advised to call back or seek an in-person evaluation if the symptoms worsen or if the condition fails to improve as anticipated.  I provided of non-face-to-face time during this encounter. Patient also on medication for chronic anxiety and excessive compulsive disorder.  States clinically stable at this time.  No major flare.  Would like to maintain same meds.  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  OCD clinically stable without anxiety patient maintain meds  Follow-up in 6 months

## 2019-04-09 ENCOUNTER — Ambulatory Visit: Payer: BC Managed Care – PPO | Admitting: Family Medicine

## 2019-04-09 ENCOUNTER — Other Ambulatory Visit: Payer: Self-pay

## 2019-04-09 ENCOUNTER — Encounter: Payer: Self-pay | Admitting: Family Medicine

## 2019-04-09 VITALS — BP 128/86 | Temp 97.2°F | Ht 70.0 in | Wt 181.0 lb

## 2019-04-09 DIAGNOSIS — R300 Dysuria: Secondary | ICD-10-CM

## 2019-04-09 DIAGNOSIS — I10 Essential (primary) hypertension: Secondary | ICD-10-CM | POA: Diagnosis not present

## 2019-04-09 DIAGNOSIS — R358 Other polyuria: Secondary | ICD-10-CM | POA: Diagnosis not present

## 2019-04-09 DIAGNOSIS — R3589 Other polyuria: Secondary | ICD-10-CM

## 2019-04-09 DIAGNOSIS — F429 Obsessive-compulsive disorder, unspecified: Secondary | ICD-10-CM

## 2019-04-09 LAB — POCT URINALYSIS DIPSTICK
Spec Grav, UA: 1.01 (ref 1.010–1.025)
pH, UA: 6 (ref 5.0–8.0)

## 2019-04-09 MED ORDER — SERTRALINE HCL 100 MG PO TABS: ORAL_TABLET | ORAL | 5 refills | Status: DC

## 2019-04-09 MED ORDER — LISINOPRIL 10 MG PO TABS: 10.0000 mg | ORAL_TABLET | Freq: Every day | ORAL | 5 refills | Status: DC

## 2019-04-09 MED ORDER — CLONAZEPAM 1 MG PO TABS: ORAL_TABLET | ORAL | 5 refills | Status: DC

## 2019-04-09 NOTE — Progress Notes (Signed)
   Subjective:    Patient ID: Robert Walsh, male    DOB: 1981/01/02, 38 y.o.   MRN: [id number]  Urinary Tract Infection  This is a new problem. Episode onset: 3 days. Associated symptoms comments: Burning with urination, lower abdominal pain. Treatments tried: azo.   Has been seeing dr Jeffie Pollock for frequent urination.   Results for orders placed or performed in visit on 04/09/19  POCT urinalysis dipstick  Result Value Ref Range   Color, UA     Clarity, UA     Glucose, UA     Bilirubin, UA     Ketones, UA     Spec Grav, UA 1.010 1.010 - 1.025   Blood, UA     pH, UA 6.0 5.0 - 8.0   Protein, UA     Urobilinogen, UA     Nitrite, UA     Leukocytes, UA     Appearance     Odor      Patient notes low abdominal discomfort.  Urinary frequency.  Some dysuria.  Some pelvic discomfort.  History is rather more complicated.  Has seen Dr. Jeffie Pollock several times.  Has had scans ultrasounds etc.  With no obvious etiology.  Multiple negative cultures.  Patient frustrated about all this.  Feels he must have something going on  Notes more stress and anxiety in recent months.  Compliant with his medication.  Takes 100 mg Zoloft daily      Review of Systems No headache, no major weight loss or weight gain, no chest pain no back pain abdominal pain no change in bowel habits complete ROS otherwise negative     Objective:   Physical Exam De Alert and oriented, vitals reviewed and stable, NAD ENT-TM's and ext canals WNL bilat via otoscopic exam Soft palate, tonsils and post pharynx WNL via oropharyngeal exam Neck-symmetric, no masses; thyroid nonpalpable and nontender Pulmonary-no tachypnea or accessory muscle use; Clear without wheezes via auscultation Card--no abnrml murmurs, rhythm reg and rate WNL Carotid pulses symmetric, without bruits Abdominal exam benign prostate exam within normal limits  Urinalysis completely normal       Assessment & Plan:  Impression suspected functional  urinary tract disease.  Long discussion held regarding interstitial cystitis.  Long discussion held regarding chronic prostatitis of the noninfectious variety.  Discussed functional nature of this.  Often accompanied by folks with substantial anxiety.  Patient's anxiety is considerably worse at this time.  Symptom care with Pyridium or Azo-Standard may help.  In the future could consider antispasmodic agents.  Doubt serious underlying disease.  Need to control anxiety discussed at length  2.  Chronic anxiety.  With history of OCD.  OCD is in decent control anxiety definitely is not.  Much worse of late.  Patient given permission to increase Klonopin use up to 1 daily.  Also recommend increasing Zoloft to 150  3.  Hypertension good control discussed maintain same meds  Numerous questions answered  Greater than 50% of this 40 minute face to face visit was spent in counseling and discussion and coordination of care regarding the above diagnosis/diagnosies

## 2019-04-11 LAB — URINE CULTURE: Organism ID, Bacteria: NO GROWTH

## 2019-09-08 ENCOUNTER — Encounter: Payer: Self-pay | Admitting: Family Medicine

## 2019-10-15 ENCOUNTER — Other Ambulatory Visit: Payer: Self-pay

## 2019-10-15 ENCOUNTER — Encounter: Payer: Self-pay | Admitting: Orthopaedic Surgery

## 2019-10-15 ENCOUNTER — Ambulatory Visit: Payer: Self-pay

## 2019-10-15 ENCOUNTER — Ambulatory Visit: Payer: BLUE CROSS/BLUE SHIELD | Admitting: Orthopaedic Surgery

## 2019-10-15 VITALS — BP 127/80 | HR 66 | Ht 69.0 in | Wt 175.0 lb

## 2019-10-15 DIAGNOSIS — M25512 Pain in left shoulder: Secondary | ICD-10-CM

## 2019-10-15 DIAGNOSIS — G8929 Other chronic pain: Secondary | ICD-10-CM | POA: Diagnosis not present

## 2019-10-15 DIAGNOSIS — M25511 Pain in right shoulder: Secondary | ICD-10-CM | POA: Diagnosis not present

## 2019-10-15 NOTE — Progress Notes (Signed)
Office Visit Note   Patient: Robert Walsh           Date of Birth: 1980-10-09           MRN: [id number] Visit Date: 10/15/2019              Requested by: Merlyn Albert, MD [address] [place],  [place] [address] PCP: Merlyn Albert, MD   Assessment & Plan: Visit Diagnoses:  1. Chronic pain of both shoulders     Plan: We reviewed x-ray findings he has some acromioclavicular changes likely related to his long history of weight lifting.  We discussed backing down some of the weights adding some aerobic activities such as jogging or mountain biking.  We discussed use of a helmet safety equipment etc.  He has continued to lift and we discussed continued problems with tendon loading with long history weight lifting.  If he has significant increased problems he can return we can consider a right shoulder MRI diagnostic imaging.  He likely is going to see some distal clavicle resorption from repetitive loading of the joint.  We will try the above recommendations and call if he is having persistent problems.  Follow-Up Instructions: No follow-ups on file.   Orders:  Orders Placed This Encounter  Procedures  . XR Shoulder Left  . XR Shoulder Right   No orders of the defined types were placed in this encounter.     Procedures: No procedures performed   Clinical Data: No additional findings.   Subjective: Chief Complaint  Patient presents with  . Right Shoulder - Pain  . 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  He states has been chronic for years.  He lifts weights at home he is taken supplements plus some injections.  He has had 1 rheumatology visit states he had subacromial injection and got significant improvement in his shoulder for period of time and then recurrence of problems.  He does have some OCD as well as some hypertension.  Pain primarily is with pushing activities he  points to the acromioclavicular joint on the right that bothers him.  Some mild discomfort with outstretched reaching.  Review of Systems Positive for hypertension lumbar spondylosis, OCD.  Objective: Vital Signs: BP 127/80   Pulse 66   Ht 5\' 9"  (1.753 m)   Wt 175 lb (79.4 kg)   BMI 25.84 kg/m   Physical Exam Constitutional:      Appearance: He is well-developed.  HENT:     Head: Normocephalic and atraumatic.  Eyes:     Pupils: Pupils are equal, round, and reactive to light.  Neck:     Thyroid: No thyromegaly.     Trachea: No tracheal deviation.  Cardiovascular:     Rate and Rhythm: Normal rate.  Pulmonary:     Effort: Pulmonary effort is normal.     Breath sounds: No wheezing.  Abdominal:     General: Bowel sounds are normal.     Palpations: Abdomen is soft.  Skin:    General: Skin is warm and dry.     Capillary Refill: Capillary refill takes less than 2 seconds.  Neurological:     Mental Status: He is alert and oriented to person, place, and time.  Psychiatric:        Behavior: Behavior normal.        Thought Content: Thought content normal.  Judgment: Judgment normal.     Ortho Exam patient has prominent muscular development upper extremities all muscle groups.  Only mild biceps tendon tenderness negative Yergason.  Mild discomfort with impingement test mild discomfort with crossarm test and more moderate pain with palpation over the acromioclavicular joint.  No joint subluxation.  Full passive active range of motion.  Specialty Comments:  No specialty comments available.  Imaging: No results found.   PMFS History: Patient Active Problem List   Diagnosis Date Noted  . Essential hypertension 02/28/2018  . Lumbar spondylosis with myelopathy 12/24/2015  . Other malaise and fatigue 02/12/2014  . OCD (obsessive compulsive disorder) 03/02/2013  . Insomnia 03/02/2013   Past Medical History:  Diagnosis Date  . Depression   . Insomnia   . Obsessive  compulsive disorder     No family history on file.  No past surgical history on file. Social History   Occupational History  . Not on file  Tobacco Use  . Smoking status: Former Research scientist (life sciences)  . Smokeless tobacco: Former Network engineer and Sexual Activity  . Alcohol use: Not on file  . Drug use: Not on file  . Sexual activity: Not on file

## 2019-10-24 ENCOUNTER — Other Ambulatory Visit: Payer: Self-pay | Admitting: Family Medicine

## 2019-10-24 NOTE — Telephone Encounter (Signed)
Ok times one 

## 2019-10-29 ENCOUNTER — Other Ambulatory Visit: Payer: Self-pay

## 2019-10-29 ENCOUNTER — Ambulatory Visit (INDEPENDENT_AMBULATORY_CARE_PROVIDER_SITE_OTHER): Payer: BC Managed Care – PPO | Admitting: Family Medicine

## 2019-10-29 DIAGNOSIS — F429 Obsessive-compulsive disorder, unspecified: Secondary | ICD-10-CM

## 2019-10-29 DIAGNOSIS — I10 Essential (primary) hypertension: Secondary | ICD-10-CM | POA: Diagnosis not present

## 2019-10-29 MED ORDER — SERTRALINE HCL 100 MG PO TABS: ORAL_TABLET | ORAL | 5 refills | Status: DC

## 2019-10-29 MED ORDER — CLONAZEPAM 1 MG PO TABS: ORAL_TABLET | ORAL | 5 refills | Status: DC

## 2019-10-29 NOTE — Progress Notes (Signed)
   Subjective:  Audio plus video  Patient ID: Robert Walsh, male    DOB: August 31, 1980, 39 y.o.   MRN: [id number]  HPI Patient calling today for medication check and refill.  He states he is doing well on meds but has not been taking the lisinopril. Last BP check was a few weeks ago and patient reports it was 128/79.  bp has been very good  Patient also states he is changing jobs and going back to 3rd shift.  Patient still takes his medication for anxiety and for OCD.  States medication definitely helps.  Also uses benzodiazepines for insomnia.   No headache no chest pain no shortness of breath Review of Systems    Objective:   Physical Exam   Virtual     Assessment & Plan:  Impression #1 essential hypertension.  Now off medication.  Blood pressure good without meds.  2.  OCD with anxiety and insomnia.  Medications refilled.  Exercise encouraged follow-up in 6 months

## 2019-11-18 DIAGNOSIS — R5383 Other fatigue: Secondary | ICD-10-CM | POA: Diagnosis not present

## 2019-11-18 DIAGNOSIS — M2550 Pain in unspecified joint: Secondary | ICD-10-CM | POA: Diagnosis not present

## 2019-11-18 DIAGNOSIS — R6882 Decreased libido: Secondary | ICD-10-CM | POA: Diagnosis not present

## 2019-11-18 DIAGNOSIS — E291 Testicular hypofunction: Secondary | ICD-10-CM | POA: Diagnosis not present

## 2019-11-24 DIAGNOSIS — R5383 Other fatigue: Secondary | ICD-10-CM | POA: Diagnosis not present

## 2019-11-24 DIAGNOSIS — E291 Testicular hypofunction: Secondary | ICD-10-CM | POA: Diagnosis not present

## 2019-11-24 DIAGNOSIS — R6882 Decreased libido: Secondary | ICD-10-CM | POA: Diagnosis not present

## 2019-12-10 DIAGNOSIS — M25521 Pain in right elbow: Secondary | ICD-10-CM | POA: Diagnosis not present

## 2020-02-11 DIAGNOSIS — M25521 Pain in right elbow: Secondary | ICD-10-CM | POA: Diagnosis not present

## 2020-02-12 ENCOUNTER — Ambulatory Visit: Payer: 59 | Admitting: Surgery

## 2020-02-12 ENCOUNTER — Encounter: Payer: Self-pay | Admitting: Surgery

## 2020-02-12 ENCOUNTER — Ambulatory Visit (INDEPENDENT_AMBULATORY_CARE_PROVIDER_SITE_OTHER): Payer: 59

## 2020-02-12 DIAGNOSIS — M545 Low back pain, unspecified: Secondary | ICD-10-CM

## 2020-02-12 DIAGNOSIS — G8929 Other chronic pain: Secondary | ICD-10-CM

## 2020-02-12 DIAGNOSIS — M5416 Radiculopathy, lumbar region: Secondary | ICD-10-CM

## 2020-02-12 MED ORDER — METHYLPREDNISOLONE 4 MG PO TABS: 4.0000 mg | ORAL_TABLET | Freq: Every day | ORAL | 0 refills | Status: DC

## 2020-02-12 MED ORDER — METHOCARBAMOL 500 MG PO TABS: 500.0000 mg | ORAL_TABLET | Freq: Two times a day (BID) | ORAL | 0 refills | Status: DC | PRN

## 2020-02-12 NOTE — Progress Notes (Signed)
Office Visit Note   Patient: Robert Walsh           Date of Birth: [birth year]           MRN: [id number] Visit Date: 02/12/2020              Requested by: Merlyn Albert, MD No address on file PCP: Merlyn Albert, MD   Assessment & Plan: Visit Diagnoses:  1. Chronic low back pain, unspecified back pain laterality, unspecified whether sciatica present   2. Radiculopathy, lumbar region     Plan:  I will attempt to treat patient conservatively.  Sent in prescriptions for Medrol Dosepak 6-day taper to be taken as directed along with Robaxin 500 mg p.o. every 12 hours as needed for spasms.  He will continue to modify his activity to avoid doing things that may aggravate his pain.  Follow with Dr. Ophelia Charter in 3 weeks for recheck.  If he has not had any improvement or if symptoms worsen it may be a good idea to get a lumbar MRI to rule out HNP/stenosis.  All questions answered.   Follow-Up Instructions: Return in about 3 weeks (around 03/04/2020) for with dr yates recheck lumbar radiculopathy.   Orders:  Orders Placed This Encounter  Procedures  . XR Lumbar Spine Complete   Meds ordered this encounter  Medications  . methylPREDNISolone (MEDROL) 4 MG tablet    Sig: Take 1 tablet (4 mg total) by mouth daily. 6 day taper to be taken as directed.    Dispense:  21 tablet    Refill:  0  . methocarbamol (ROBAXIN) 500 MG tablet    Sig: Take 1 tablet (500 mg total) by mouth every 12 (twelve) hours as needed for muscle spasms.    Dispense:  30 tablet    Refill:  0      Procedures: No procedures performed   Clinical Data: No additional findings.   Subjective: Chief Complaint  Patient presents with  . Lower Back - Pain    HPI 39 year old white male comes in today with complaints of chronic low back pain and right lower extremity radiculopathy.  Patient has seen Dr. Jene Every with [facility] in the past for lumbar spine issues.  Looks like last visit with him  may have been in 2017.  Patient states that Dr. Shelle Iron had recommended lumbar ESI at that time but he never had this done.  States that last 6 months his pain has been getting worse with right-sided low back pain radiating to his buttock.  He has had intermittent episodes of pain radiating down his leg toward his calf but this has not been too frequent.  No symptoms on the left side.  Denies lower extremity numbness tingling weakness.  He continues to remain active with weight training but tries to avoid some lower body activity lifting.  He is employed with [employer] and has not had to miss any days at work.   Not currently taking any medication.  Review of Systems No current cardiac pulmonary GI GU issues  Objective: Vital Signs: There were no vitals taken for this visit.  Physical Exam HENT:     Head: Normocephalic and atraumatic.  Eyes:     Extraocular Movements: Extraocular movements intact.     Pupils: Pupils are equal, round, and reactive to light.  Pulmonary:     Effort: No respiratory distress.  Musculoskeletal:     Comments: Very pleasant white male alert and oriented  in no acute distress.  Gait is normal.  Heel and toe gait normal.  He does have central low back pain with lumbar extension.  Lumbar flexion hands to ankles with no discomfort.  Is mild lumbar paraspinal tenderness.  Negative logroll bilateral hips.  Negative straight leg raise.  Bilateral calves nontender.  Neurovascular intact.  No focal motor deficits.  Skin:    General: Skin is warm and dry.  Neurological:     General: No focal deficit present.     Mental Status: He is alert and oriented to person, place, and time.  Psychiatric:        Mood and Affect: Mood normal.     Ortho Exam  Specialty Comments:  No specialty comments available.  Imaging: No results found.   PMFS History: Patient Active Problem List   Diagnosis Date Noted  . Essential hypertension 02/28/2018  . Lumbar spondylosis with  myelopathy 12/24/2015  . Other malaise and fatigue 02/12/2014  . OCD (obsessive compulsive disorder) 03/02/2013  . Insomnia 03/02/2013   Past Medical History:  Diagnosis Date  . Depression   . Insomnia   . Obsessive compulsive disorder     History reviewed. No pertinent family history.  History reviewed. No pertinent surgical history. Social History   Occupational History  . Not on file  Tobacco Use  . Smoking status: Former Games developer  . Smokeless tobacco: Former Engineer, water and Sexual Activity  . Alcohol use: Not on file  . Drug use: Not on file  . Sexual activity: Not on file

## 2020-03-03 ENCOUNTER — Ambulatory Visit: Payer: 59 | Admitting: Orthopaedic Surgery

## 2020-03-17 ENCOUNTER — Encounter: Payer: Self-pay | Admitting: Orthopaedic Surgery

## 2020-03-17 ENCOUNTER — Ambulatory Visit: Payer: 59 | Admitting: Orthopaedic Surgery

## 2020-03-17 DIAGNOSIS — M545 Low back pain, unspecified: Secondary | ICD-10-CM

## 2020-03-17 NOTE — Progress Notes (Signed)
Office Visit Note   Patient: Robert Walsh           Date of Birth: Oct 23, 1980           MRN: [id number] Visit Date: 03/17/2020              Requested by: Merlyn Albert, MD No address on file PCP: Annalee Genta, DO   Assessment & Plan: Visit Diagnoses:  1. Low back pain without sciatica, unspecified back pain laterality, unspecified chronicity     Plan: Patient will call if he has recurrence of back symptoms.  We can consider repeat diagnostic imaging if he continues to have problems with currently is 95% improved and office follow-up will be as needed.  Follow-Up Instructions: Return if symptoms worsen or fail to improve.   Orders:  No orders of the defined types were placed in this encounter.  No orders of the defined types were placed in this encounter.     Procedures: No procedures performed   Clinical Data: No additional findings.   Subjective: Chief Complaint  Patient presents with  . Lower Back - Pain, Follow-up    HPI patient current follow-up low back pain Medrol Dosepak gave him 95% relief as well as with the Robaxin he is feeling good he states he has had some days with had 0 discomfort.  He is back to normal activity and is happy with the results of treatment.  Patient had seen Dr. Shelle Iron about 5 years ago for similar problems.  Review of Systems Unchanged from previous visit.  Objective: Vital Signs: BP 133/69   Pulse 84   Ht 5\' 9"  (1.753 m)   Wt 185 lb (83.9 kg)   BMI 27.32 kg/m   Physical Exam Constitutional:      Appearance: He is well-developed.  HENT:     Head: Normocephalic and atraumatic.  Eyes:     Pupils: Pupils are equal, round, and reactive to light.  Neck:     Thyroid: No thyromegaly.     Trachea: No tracheal deviation.  Cardiovascular:     Rate and Rhythm: Normal rate.  Pulmonary:     Effort: Pulmonary effort is normal.     Breath sounds: No wheezing.  Abdominal:     General: Bowel sounds are normal.      Palpations: Abdomen is soft.  Skin:    General: Skin is warm and dry.     Capillary Refill: Capillary refill takes less than 2 seconds.  Neurological:     Mental Status: He is alert and oriented to person, place, and time.  Psychiatric:        Behavior: Behavior normal.        Thought Content: Thought content normal.        Judgment: Judgment normal.     Ortho Exam negative straight leg raising normal heel toe gait no isolated  motor weakness lower extremities.  Specialty Comments:  No specialty comments available.  Imaging: No results found.   PMFS History: Patient Active Problem List   Diagnosis Date Noted  . Low back pain 03/17/2020  . Essential hypertension 02/28/2018  . Lumbar spondylosis with myelopathy 12/24/2015  . Other malaise and fatigue 02/12/2014  . OCD (obsessive compulsive disorder) 03/02/2013  . Insomnia 03/02/2013   Past Medical History:  Diagnosis Date  . Depression   . Insomnia   . Obsessive compulsive disorder     No family history on file.  No past surgical history on file.  Social History   Occupational History  . Not on file  Tobacco Use  . Smoking status: Former Games developer  . Smokeless tobacco: Former Engineer, water and Sexual Activity  . Alcohol use: Not on file  . Drug use: Not on file  . Sexual activity: Not on file

## 2020-04-22 ENCOUNTER — Ambulatory Visit: Payer: Self-pay

## 2020-05-13 ENCOUNTER — Other Ambulatory Visit: Payer: Self-pay | Admitting: Family Medicine

## 2020-05-13 NOTE — Telephone Encounter (Signed)
Please contact patient to have patient set up appt with Dr.Taylor. then may route back to nurses. Thank you

## 2020-05-14 NOTE — Telephone Encounter (Signed)
lvm to schedule appt.  

## 2020-05-17 NOTE — Telephone Encounter (Signed)
Scheduled appointment 11/4

## 2020-06-10 ENCOUNTER — Ambulatory Visit (INDEPENDENT_AMBULATORY_CARE_PROVIDER_SITE_OTHER): Payer: 59 | Admitting: Family Medicine

## 2020-06-10 ENCOUNTER — Other Ambulatory Visit: Payer: Self-pay

## 2020-06-10 ENCOUNTER — Encounter: Payer: Self-pay | Admitting: Family Medicine

## 2020-06-10 VITALS — BP 138/92 | HR 96 | Temp 97.8°F | Wt 184.8 lb

## 2020-06-10 DIAGNOSIS — G4700 Insomnia, unspecified: Secondary | ICD-10-CM

## 2020-06-10 DIAGNOSIS — Z79899 Other long term (current) drug therapy: Secondary | ICD-10-CM | POA: Diagnosis not present

## 2020-06-10 DIAGNOSIS — F429 Obsessive-compulsive disorder, unspecified: Secondary | ICD-10-CM

## 2020-06-10 DIAGNOSIS — I10 Essential (primary) hypertension: Secondary | ICD-10-CM

## 2020-06-10 MED ORDER — SERTRALINE HCL 100 MG PO TABS: ORAL_TABLET | ORAL | 2 refills | Status: DC

## 2020-06-10 MED ORDER — CLONAZEPAM 1 MG PO TABS: ORAL_TABLET | ORAL | 2 refills | Status: DC

## 2020-06-10 NOTE — Progress Notes (Signed)
Patient ID: Robert Walsh, male    DOB: 08-30-1980, 39 y.o.   MRN: [id number]   Chief Complaint  Patient presents with  . Hypertension  . OCD with anxiety and insomnia   Subjective:    HPI  Pt seen for f/u htn and ocd with anxiety.  Pt had surgery ortho.  Dr. Ophelia Charter seeing with ortho for back pain with rt hip and sciatica Did some prednisone but helped inflammation. Feeling better now.  Was on robaxin and prednisone. occ taking robaxin. Felt muscle tightness. Taking ibuprofen prn. Taking tylenol prn.  Left elbow pain- offered surgery or not much other help.  H/o ocd and anxiety- Pt haven't had medications for a month- zoloft.  Was 3 days late from pharm, forgot to pick it up, then decided didn't want to take it. Working 3rd shift and was on klonapin for sleep, in the past.  Lost weight after stopping the zoloft. Excessive worrying in the past for why was started on the zoloft. In the past year went to 150mg  zoloft. Concerned that he has to take a med daily to address his ocd and anxiety. But realizing that it does help.  H/o htn- not on meds currently.   Medical History Caison has a past medical history of Depression, Insomnia, and Obsessive compulsive disorder.   Outpatient Encounter Medications as of 06/10/2020  Medication Sig  . clonazePAM (KLONOPIN) 1 MG tablet TAKE ONE TABLET BY MOUTH DAILY AS NEEDEDFOR ANXIETY.  . methocarbamol (ROBAXIN) 500 MG tablet Take 1 tablet (500 mg total) by mouth every 12 (twelve) hours as needed for muscle spasms.  13/11/2019 sertraline (ZOLOFT) 100 MG tablet TAKE ONE (1) AND A HALF TABLETS BY MOUTH for 7 days, then inc to 2 tab daily.  . [DISCONTINUED] clonazePAM (KLONOPIN) 1 MG tablet TAKE ONE TABLET BY MOUTH DAILY AS NEEDEDFOR ANXIETY.  . [DISCONTINUED] methylPREDNISolone (MEDROL) 4 MG tablet Take 1 tablet (4 mg total) by mouth daily. 6 day taper to be taken as directed. (Patient not taking: Reported on 03/17/2020)  . [DISCONTINUED]  sertraline (ZOLOFT) 100 MG tablet TAKE ONE (1) AND A HALF TABLETS BY MOUTH EVERY DAY   No facility-administered encounter medications on file as of 06/10/2020.     Review of Systems  Constitutional: Negative for chills and fever.  HENT: Negative for congestion, rhinorrhea and sore throat.   Respiratory: Negative for cough, shortness of breath and wheezing.   Cardiovascular: Negative for chest pain and leg swelling.  Gastrointestinal: Negative for abdominal pain, diarrhea, nausea and vomiting.  Genitourinary: Negative for dysuria and frequency.  Skin: Negative for rash.  Neurological: Negative for dizziness, weakness and headaches.  Psychiatric/Behavioral: Positive for sleep disturbance. Negative for agitation and dysphoric mood. The patient is nervous/anxious.        +ocd     Vitals BP (!) 138/92   Pulse 96   Temp 97.8 F (36.6 C)   Wt 184 lb 12.8 oz (83.8 kg)   SpO2 98%   BMI 27.29 kg/m   Objective:   Physical Exam Vitals and nursing note reviewed.  Constitutional:      General: He is not in acute distress.    Appearance: Normal appearance. He is not ill-appearing.  HENT:     Head: Normocephalic.     Nose: Nose normal. No congestion.     Mouth/Throat:     Mouth: Mucous membranes are moist.     Pharynx: No oropharyngeal exudate.  Eyes:     Extraocular Movements: Extraocular  movements intact.     Conjunctiva/sclera: Conjunctivae normal.     Pupils: Pupils are equal, round, and reactive to light.  Cardiovascular:     Rate and Rhythm: Normal rate and regular rhythm.     Pulses: Normal pulses.     Heart sounds: Normal heart sounds. No murmur heard.   Pulmonary:     Effort: Pulmonary effort is normal.     Breath sounds: Normal breath sounds. No wheezing, rhonchi or rales.  Musculoskeletal:        General: Normal range of motion.     Right lower leg: No edema.     Left lower leg: No edema.  Skin:    General: Skin is warm and dry.     Findings: No rash.    Neurological:     General: No focal deficit present.     Mental Status: He is alert and oriented to person, place, and time.     Cranial Nerves: No cranial nerve deficit.  Psychiatric:        Mood and Affect: Mood normal.        Behavior: Behavior normal.        Thought Content: Thought content normal.        Judgment: Judgment normal.      Assessment and Plan   1. Obsessive-compulsive disorder, unspecified type - sertraline (ZOLOFT) 100 MG tablet; TAKE ONE (1) AND A HALF TABLETS BY MOUTH for 7 days, then inc to 2 tab daily.  Dispense: 60 tablet; Refill: 2 - clonazePAM (KLONOPIN) 1 MG tablet; TAKE ONE TABLET BY MOUTH DAILY AS NEEDEDFOR ANXIETY.  Dispense: 25 tablet; Refill: 2  2. High risk medication use - ToxASSURE Select 13 (MW), Urine  3. Insomnia, unspecified type - clonazePAM (KLONOPIN) 1 MG tablet; TAKE ONE TABLET BY MOUTH DAILY AS NEEDEDFOR ANXIETY.  Dispense: 25 tablet; Refill: 2  4. Essential hypertension   htn- suboptimal. Advising to dec salt intake and cont to monitor.  Not on meds at this time.  OCD/anxiety- restart zoloft and increase from 150mg  to 200mg  daily. Anxiety/ insomnia-Take klonapin prn for anxeity/insomnia.   F/u 3 mo or prn.

## 2020-06-14 ENCOUNTER — Encounter: Payer: Self-pay | Admitting: Family Medicine

## 2020-06-14 DIAGNOSIS — Z87442 Personal history of urinary calculi: Secondary | ICD-10-CM | POA: Insufficient documentation

## 2020-06-15 ENCOUNTER — Other Ambulatory Visit: Payer: Self-pay | Admitting: Family Medicine

## 2020-06-15 DIAGNOSIS — Z79899 Other long term (current) drug therapy: Secondary | ICD-10-CM

## 2020-06-15 DIAGNOSIS — R300 Dysuria: Secondary | ICD-10-CM

## 2020-06-15 DIAGNOSIS — I10 Essential (primary) hypertension: Secondary | ICD-10-CM

## 2020-06-15 LAB — TOXASSURE SELECT 13 (MW), URINE

## 2020-07-05 DIAGNOSIS — I10 Essential (primary) hypertension: Secondary | ICD-10-CM | POA: Diagnosis not present

## 2020-07-05 DIAGNOSIS — Z79899 Other long term (current) drug therapy: Secondary | ICD-10-CM | POA: Diagnosis not present

## 2020-07-05 DIAGNOSIS — R300 Dysuria: Secondary | ICD-10-CM | POA: Diagnosis not present

## 2020-07-06 LAB — CBC WITH DIFFERENTIAL/PLATELET
Basophils Absolute: 0 10*3/uL (ref 0.0–0.2)
Basos: 1 %
EOS (ABSOLUTE): 0.1 10*3/uL (ref 0.0–0.4)
Eos: 2 %
Hematocrit: 46 % (ref 37.5–51.0)
Hemoglobin: 15.6 g/dL (ref 13.0–17.7)
Immature Grans (Abs): 0 10*3/uL (ref 0.0–0.1)
Immature Granulocytes: 0 %
Lymphocytes Absolute: 1.5 10*3/uL (ref 0.7–3.1)
Lymphs: 27 %
MCH: 30.8 pg (ref 26.6–33.0)
MCHC: 33.9 g/dL (ref 31.5–35.7)
MCV: 91 fL (ref 79–97)
Monocytes Absolute: 0.4 10*3/uL (ref 0.1–0.9)
Monocytes: 8 %
Neutrophils Absolute: 3.5 10*3/uL (ref 1.4–7.0)
Neutrophils: 62 %
Platelets: 197 10*3/uL (ref 150–450)
RBC: 5.07 x10E6/uL (ref 4.14–5.80)
RDW: 14.1 % (ref 11.6–15.4)
WBC: 5.6 10*3/uL (ref 3.4–10.8)

## 2020-07-06 LAB — URINALYSIS, ROUTINE W REFLEX MICROSCOPIC
Bilirubin, UA: NEGATIVE
Glucose, UA: NEGATIVE
Ketones, UA: NEGATIVE
Leukocytes,UA: NEGATIVE
Nitrite, UA: NEGATIVE
Protein,UA: NEGATIVE
RBC, UA: NEGATIVE
Specific Gravity, UA: 1.026 (ref 1.005–1.030)
Urobilinogen, Ur: 0.2 mg/dL (ref 0.2–1.0)
pH, UA: 6 (ref 5.0–7.5)

## 2020-07-06 LAB — COMPREHENSIVE METABOLIC PANEL
ALT: 31 IU/L (ref 0–44)
AST: 28 IU/L (ref 0–40)
Albumin/Globulin Ratio: 2.2 (ref 1.2–2.2)
Albumin: 4.8 g/dL (ref 4.0–5.0)
Alkaline Phosphatase: 53 IU/L (ref 44–121)
BUN/Creatinine Ratio: 17 (ref 9–20)
BUN: 18 mg/dL (ref 6–20)
Bilirubin Total: 0.7 mg/dL (ref 0.0–1.2)
CO2: 25 mmol/L (ref 20–29)
Calcium: 9.6 mg/dL (ref 8.7–10.2)
Chloride: 102 mmol/L (ref 96–106)
Creatinine, Ser: 1.09 mg/dL (ref 0.76–1.27)
GFR calc Af Amer: 99 mL/min/{1.73_m2} (ref >59)
GFR calc non Af Amer: 86 mL/min/{1.73_m2} (ref >59)
Globulin, Total: 2.2 g/dL (ref 1.5–4.5)
Glucose: 92 mg/dL (ref 65–99)
Potassium: 4.7 mmol/L (ref 3.5–5.2)
Sodium: 140 mmol/L (ref 134–144)
Total Protein: 7 g/dL (ref 6.0–8.5)

## 2020-08-31 MED ORDER — OXYBUTYNIN CHLORIDE ER 5 MG PO TB24: 5.0000 mg | ORAL_TABLET | Freq: Every day | ORAL | 1 refills | Status: DC

## 2020-09-01 ENCOUNTER — Other Ambulatory Visit: Payer: Self-pay | Admitting: *Deleted

## 2020-09-01 MED ORDER — OXYBUTYNIN CHLORIDE ER 5 MG PO TB24
5.0000 mg | ORAL_TABLET | Freq: Every day | ORAL | 1 refills | Status: DC
Start: 2020-09-01 — End: 2020-09-23

## 2020-09-10 ENCOUNTER — Ambulatory Visit: Payer: 59 | Admitting: Family Medicine

## 2020-09-23 ENCOUNTER — Other Ambulatory Visit: Payer: Self-pay

## 2020-09-23 ENCOUNTER — Ambulatory Visit: Payer: 59 | Admitting: Family Medicine

## 2020-09-23 ENCOUNTER — Encounter: Payer: Self-pay | Admitting: Family Medicine

## 2020-09-23 VITALS — BP 126/82 | HR 97 | Temp 97.0°F | Ht 69.0 in | Wt 185.6 lb

## 2020-09-23 DIAGNOSIS — R350 Frequency of micturition: Secondary | ICD-10-CM | POA: Diagnosis not present

## 2020-09-23 DIAGNOSIS — G4700 Insomnia, unspecified: Secondary | ICD-10-CM

## 2020-09-23 DIAGNOSIS — F429 Obsessive-compulsive disorder, unspecified: Secondary | ICD-10-CM

## 2020-09-23 MED ORDER — SERTRALINE HCL 100 MG PO TABS: 200.0000 mg | ORAL_TABLET | Freq: Every day | ORAL | 2 refills | Status: DC

## 2020-09-23 MED ORDER — HYDROXYZINE HCL 25 MG PO TABS: 25.0000 mg | ORAL_TABLET | Freq: Three times a day (TID) | ORAL | 0 refills | Status: DC | PRN

## 2020-09-23 MED ORDER — CLONAZEPAM 1 MG PO TABS: ORAL_TABLET | ORAL | 2 refills | Status: DC

## 2020-09-23 NOTE — Progress Notes (Signed)
Patient ID: Robert Walsh, male    DOB: July 05, 1981, 40 y.o.   MRN: [id number]   Chief Complaint  Patient presents with  . ocd with anxiety follow up   Subjective:    HPI  F/u on anxiety and ocd.  Pt working on third shift and doing well with it.  Anxiety- Pt was on 1 per day of klonapin 1mg .  zoloft 150mg  went up to the 200mg  on our last visit.    Sometimes needing to take the klonapin only a few times per month, pt feeling not needing 30 per month. Hard sleep with sun is out. Helps with sleep. By the 3 or 4th day it not as effective.  But not taking daily.  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  Thinking may switch to doing this. Dealing anxiety with other people around during the day so working 3rd shift is better for his social anxiety.  Urinary frequency issue- Went to Dr. , in 2020 and they couldn't find an issue. Had work up. Had h/o kidney stone in past.  Feeling since- 2011 or 2012 stone.  After that then might have had a urgency issue. Has had decrease flow and not much is coming out.  Tried oxybutynin, by me and pt stating it didn't help.   Medical History Robert Walsh has a past medical history of Depression, Insomnia, and Obsessive compulsive disorder.   Outpatient Encounter Medications as of 09/23/2020  Medication Sig  . hydrOXYzine (ATARAX/VISTARIL) 25 MG tablet Take 1 tablet (25 mg total) by mouth 3 (three) times daily as needed. Caution causes sedation  . clonazePAM (KLONOPIN) 1 MG tablet TAKE ONE TABLET BY MOUTH DAILY AS NEEDEDFOR ANXIETY.  . methocarbamol (ROBAXIN) 500 MG tablet Take 1 tablet (500 mg total) by mouth every 12 (twelve) hours as needed for muscle spasms.  2013 sertraline (ZOLOFT) 100 MG tablet Take 2 tablets (200 mg total) by mouth daily.  . [DISCONTINUED] clonazePAM (KLONOPIN) 1 MG tablet TAKE ONE TABLET BY MOUTH DAILY AS NEEDEDFOR ANXIETY.  . [DISCONTINUED] oxybutynin (DITROPAN-XL) 5 MG 24 hr tablet Take 1 tablet (5 mg total) by mouth  at bedtime.  . [DISCONTINUED] sertraline (ZOLOFT) 100 MG tablet TAKE ONE (1) AND A HALF TABLETS BY MOUTH for 7 days, then inc to 2 tab daily.   No facility-administered encounter medications on file as of 09/23/2020.     Review of Systems  Constitutional: Negative for chills and fever.  HENT: Negative for congestion, rhinorrhea and sore throat.   Respiratory: Negative for cough, shortness of breath and wheezing.   Cardiovascular: Negative for chest pain and leg swelling.  Gastrointestinal: Negative for abdominal pain, diarrhea, nausea and vomiting.  Genitourinary: Positive for frequency and urgency. Negative for decreased urine volume, difficulty urinating, dysuria, flank pain, hematuria, penile discharge and penile pain.  Skin: Negative for rash.  Neurological: Negative for dizziness, weakness and headaches.  Psychiatric/Behavioral: Negative for dysphoric mood, self-injury, sleep disturbance and suicidal ideas. The patient is nervous/anxious (stable).      Vitals BP 126/82   Pulse 97   Temp (!) 97 F (36.1 C) (Oral)   Ht 5\' 9"  (1.753 m)   Wt 185 lb 9.6 oz (84.2 kg)   SpO2 99%   BMI 27.41 kg/m   Objective:   Physical Exam Vitals and nursing note reviewed.  Constitutional:      General: He is not in acute distress.    Appearance: Normal appearance. He is not ill-appearing.  HENT:  Head: Normocephalic.     Nose: Nose normal. No congestion.     Mouth/Throat:     Mouth: Mucous membranes are moist.     Pharynx: No oropharyngeal exudate.  Eyes:     Extraocular Movements: Extraocular movements intact.     Conjunctiva/sclera: Conjunctivae normal.     Pupils: Pupils are equal, round, and reactive to light.  Cardiovascular:     Rate and Rhythm: Normal rate and regular rhythm.     Pulses: Normal pulses.     Heart sounds: Normal heart sounds. No murmur heard.   Pulmonary:     Effort: Pulmonary effort is normal. No respiratory distress.     Breath sounds: Normal breath  sounds. No wheezing, rhonchi or rales.  Musculoskeletal:        General: Normal range of motion.     Right lower leg: No edema.     Left lower leg: No edema.  Skin:    General: Skin is warm and dry.     Findings: No rash.  Neurological:     General: No focal deficit present.     Mental Status: He is alert and oriented to person, place, and time.     Cranial Nerves: No cranial nerve deficit.  Psychiatric:        Mood and Affect: Mood normal.        Behavior: Behavior normal.        Thought Content: Thought content normal.        Judgment: Judgment normal.      Assessment and Plan   1. Obsessive-compulsive disorder, unspecified type - clonazePAM (KLONOPIN) 1 MG tablet; TAKE ONE TABLET BY MOUTH DAILY AS NEEDEDFOR ANXIETY.  Dispense: 15 tablet; Refill: 2 - sertraline (ZOLOFT) 100 MG tablet; Take 2 tablets (200 mg total) by mouth daily.  Dispense: 60 tablet; Refill: 2  2. Insomnia, unspecified type - clonazePAM (KLONOPIN) 1 MG tablet; TAKE ONE TABLET BY MOUTH DAILY AS NEEDEDFOR ANXIETY.  Dispense: 15 tablet; Refill: 2  3. Urinary frequency   Ocd/anxiety- stable.  Cont with klonopin and zoloft.   Urinary frequency- has had this worked up in 2020 and seen by Urology. dec water intake before bed.  Avoid caffeine before bed.  Recommending trial of hydroxyzine, this will help with sleep and may help with the frequency he's feeling and during day may try claritin or allergra to help with the frequency feeling. If not improving may need to get referral back to urology.  F/u 20mo or prn.

## 2020-11-01 ENCOUNTER — Other Ambulatory Visit: Payer: Self-pay | Admitting: *Deleted

## 2020-11-01 DIAGNOSIS — F429 Obsessive-compulsive disorder, unspecified: Secondary | ICD-10-CM

## 2020-11-01 DIAGNOSIS — G4700 Insomnia, unspecified: Secondary | ICD-10-CM

## 2020-11-01 MED ORDER — CLONAZEPAM 1 MG PO TABS: ORAL_TABLET | ORAL | 0 refills | Status: DC

## 2020-11-01 MED ORDER — SERTRALINE HCL 100 MG PO TABS: 200.0000 mg | ORAL_TABLET | Freq: Every day | ORAL | 0 refills | Status: DC

## 2020-11-01 MED ORDER — HYDROXYZINE HCL 25 MG PO TABS
25.0000 mg | ORAL_TABLET | Freq: Three times a day (TID) | ORAL | 0 refills | Status: DC | PRN
Start: 2020-11-01 — End: 2020-11-01

## 2020-11-01 MED FILL — CLONAZEPAM 1 MG TABS: 1 | 15 days supply | Qty: 15 | Fill #0

## 2020-11-01 MED FILL — HYDROXYZINE HCL 25 MG TABS: 25 | 15 days supply | Qty: 45 | Fill #0

## 2020-11-01 MED FILL — SERTRALINE HCL 100 MG TABS: 100 | 30 days supply | Qty: 60 | Fill #0

## 2020-11-01 NOTE — Telephone Encounter (Signed)
Need signature and then will fax klonopin. Other two meds sent electronically.

## 2020-12-22 ENCOUNTER — Other Ambulatory Visit: Payer: Self-pay

## 2020-12-22 ENCOUNTER — Other Ambulatory Visit (HOSPITAL_COMMUNITY): Payer: Self-pay

## 2020-12-22 ENCOUNTER — Encounter: Payer: Self-pay | Admitting: Family Medicine

## 2020-12-22 ENCOUNTER — Ambulatory Visit: Payer: 59 | Admitting: Family Medicine

## 2020-12-22 VITALS — BP 118/80 | HR 72 | Temp 97.9°F | Ht 69.0 in | Wt 182.0 lb

## 2020-12-22 DIAGNOSIS — G4700 Insomnia, unspecified: Secondary | ICD-10-CM | POA: Diagnosis not present

## 2020-12-22 DIAGNOSIS — F429 Obsessive-compulsive disorder, unspecified: Secondary | ICD-10-CM

## 2020-12-22 DIAGNOSIS — F419 Anxiety disorder, unspecified: Secondary | ICD-10-CM

## 2020-12-22 MED ORDER — CLONAZEPAM 1 MG PO TABS
ORAL_TABLET | Freq: Every day | ORAL | 2 refills | Status: DC | PRN
  Filled 2020-12-22 – 2021-02-09 (×3): qty 10, 10d supply, fill #0

## 2020-12-22 MED ORDER — SERTRALINE HCL 100 MG PO TABS
100.0000 mg | ORAL_TABLET | Freq: Every day | ORAL | 2 refills | Status: DC
  Filled 2020-12-22 – 2021-02-09 (×3): qty 90, 90d supply, fill #0

## 2020-12-22 NOTE — Progress Notes (Signed)
Patient ID: Robert Walsh, male    DOB: [birth year], 40 y.o.   MRN: [id number]   Chief Complaint  Patient presents with  . Depression  . Anxiety   Subjective:    HPIfollow up on anxiety and depression. Pt states he cut zoloft down to one tablet daily and seems to be doing better on that dose.  Pt went off all meds for a while, then after 6 wks went back on 100mg  zoloft. Has been back on it for 4 wks.   Pt stating the urinary frequency has improved since going back on zoloft.  Noticing it's improved.  Still going more than normal, but is improved.   Taking klonapin prn for anxiety. Not needing it daily.  Used to see urology, Dr. .  Didn't feel it helped.  H/o kidney stone 10 yrs ago. Had total of 5 stones in past.   Medical History Raynaldo has a past medical history of Depression, Insomnia, and Obsessive compulsive disorder.   Outpatient Encounter Medications as of 12/22/2020  Medication Sig  . [DISCONTINUED] clonazePAM (KLONOPIN) 1 MG tablet TAKE 1 TABLET BY MOUTH DAILY AS NEEDED FOR ANXIETY (Patient taking differently: Take by mouth daily as needed. for anxiety)  . [DISCONTINUED] sertraline (ZOLOFT) 100 MG tablet TAKE 2 TABLETS BY MOUTH ONCE A DAY  . clonazePAM (KLONOPIN) 1 MG tablet TAKE 1 TABLET BY MOUTH DAILY AS NEEDED FOR ANXIETY  . sertraline (ZOLOFT) 100 MG tablet Take 1 tablet (100 mg total) by mouth daily.  . [DISCONTINUED] hydrOXYzine (ATARAX/VISTARIL) 25 MG tablet TAKE 1 TABLET BY MOUTH 3 TIMES DAILY AS NEEDED (CAUSES SEDATION)  . [DISCONTINUED] methocarbamol (ROBAXIN) 500 MG tablet Take 1 tablet (500 mg total) by mouth every 12 (twelve) hours as needed for muscle spasms.   No facility-administered encounter medications on file as of 12/22/2020.     Review of Systems  Constitutional: Negative for chills and fever.  HENT: Negative for congestion, rhinorrhea and sore throat.   Respiratory: Negative for cough, shortness of breath and wheezing.    Cardiovascular: Negative for chest pain and leg swelling.  Gastrointestinal: Negative for abdominal pain, diarrhea, nausea and vomiting.  Genitourinary: Positive for frequency (improved). Negative for dysuria.  Skin: Negative for rash.  Neurological: Negative for dizziness, weakness and headaches.     Vitals BP 118/80   Pulse 72   Temp 97.9 F (36.6 C)   Ht 5\' 9"  (1.753 m)   Wt 182 lb (82.6 kg)   SpO2 99%   BMI 26.88 kg/m   Objective:   Physical Exam Vitals and nursing note reviewed.  Constitutional:      General: He is not in acute distress.    Appearance: Normal appearance. He is not ill-appearing.  Cardiovascular:     Rate and Rhythm: Normal rate and regular rhythm.     Pulses: Normal pulses.     Heart sounds: Normal heart sounds.  Pulmonary:     Effort: Pulmonary effort is normal. No respiratory distress.     Breath sounds: Normal breath sounds.  Musculoskeletal:        General: Normal range of motion.  Skin:    General: Skin is warm and dry.     Findings: No rash.  Neurological:     General: No focal deficit present.     Mental Status: He is alert and oriented to person, place, and time.     Cranial Nerves: No cranial nerve deficit.  Psychiatric:  Mood and Affect: Mood normal.        Behavior: Behavior normal.        Thought Content: Thought content normal.        Judgment: Judgment normal.      Assessment and Plan   1. Anxiety  2. Obsessive-compulsive disorder, unspecified type - clonazePAM (KLONOPIN) 1 MG tablet; TAKE 1 TABLET BY MOUTH DAILY AS NEEDED FOR ANXIETY  Dispense: 10 tablet; Refill: 2 - sertraline (ZOLOFT) 100 MG tablet; Take 1 tablet (100 mg total) by mouth daily.  Dispense: 90 tablet; Refill: 2  3. Insomnia, unspecified type - clonazePAM (KLONOPIN) 1 MG tablet; TAKE 1 TABLET BY MOUTH DAILY AS NEEDED FOR ANXIETY  Dispense: 10 tablet; Refill: 2   OCD, anxiety/depression- improved after a short discontinuation of 200mg  zoloft, then  resuming on 100mg  zoloft.   Pt would like to stay at this dose.  Anxiety- stable. klonapin prn.   Return in about 3 months (around 03/24/2021) for f/u anxiety.   01/03/2021

## 2020-12-30 ENCOUNTER — Other Ambulatory Visit (HOSPITAL_COMMUNITY): Payer: Self-pay

## 2021-01-12 ENCOUNTER — Other Ambulatory Visit: Payer: Self-pay

## 2021-01-12 ENCOUNTER — Ambulatory Visit (INDEPENDENT_AMBULATORY_CARE_PROVIDER_SITE_OTHER): Payer: 59 | Admitting: Family Medicine

## 2021-01-12 VITALS — BP 154/87 | HR 77 | Temp 98.2°F | Ht 70.0 in | Wt 182.0 lb

## 2021-01-12 DIAGNOSIS — Z0001 Encounter for general adult medical examination with abnormal findings: Secondary | ICD-10-CM

## 2021-01-12 DIAGNOSIS — R030 Elevated blood-pressure reading, without diagnosis of hypertension: Secondary | ICD-10-CM

## 2021-01-12 DIAGNOSIS — I10 Essential (primary) hypertension: Secondary | ICD-10-CM

## 2021-01-12 DIAGNOSIS — Z0000 Encounter for general adult medical examination without abnormal findings: Secondary | ICD-10-CM

## 2021-01-12 NOTE — Progress Notes (Addendum)
Patient ID: Robert Walsh, male    DOB: Feb 23, 1981, 40 y.o.   MRN: [id number]   Chief Complaint  Patient presents with   Annual Exam   Subjective:    HPI The patient comes in today for a wellness visit.  A review of their health history was completed.  A review of medications was also completed.  Any needed refills; none  Eating habits: health conscious  Falls/  MVA accidents in past few months: none  Regular exercise: 1 - 1.5 hours at the gym 5 -6 times per week   Specialist pt sees on regular basis: none  Preventative health issues were discussed.   Additional concerns: none  Doing well with 100mg  zoloft.   3rd shift with working.  Was on testosterone treatment in past- dose around 100mg . Pt decided to wean off this, stopped off 6-99mo ago.  Pt stating had 3-4 months of "feeling badly" when he stopped the testosterone.  Medical History Robert Walsh has a past medical history of Depression, Insomnia, and Obsessive compulsive disorder.   Outpatient Encounter Medications as of 01/12/2021  Medication Sig   clonazePAM (KLONOPIN) 1 MG tablet TAKE 1 TABLET BY MOUTH DAILY AS NEEDED FOR ANXIETY   sertraline (ZOLOFT) 100 MG tablet Take 1 tablet (100 mg total) by mouth daily.   No facility-administered encounter medications on file as of 01/12/2021.     Review of Systems  Constitutional:  Negative for chills and fever.  HENT:  Negative for congestion, rhinorrhea and sore throat.   Respiratory:  Negative for cough, shortness of breath and wheezing.   Cardiovascular:  Negative for chest pain and leg swelling.  Gastrointestinal:  Negative for abdominal pain, diarrhea, nausea and vomiting.  Genitourinary:  Negative for dysuria and frequency.  Skin:  Negative for rash.  Neurological:  Negative for dizziness, weakness and headaches.    Vitals BP (!) 154/87   Pulse 77   Temp 98.2 F (36.8 C)   Ht 5\' 10"  (1.778 m)   Wt 182 lb (82.6 kg)   SpO2 97%   BMI 26.11 kg/m    Objective:   Physical Exam Vitals and nursing note reviewed.  Constitutional:      General: He is not in acute distress.    Appearance: Normal appearance. He is not ill-appearing.  HENT:     Head: Normocephalic.     Right Ear: Tympanic membrane, ear canal and external ear normal.     Left Ear: Tympanic membrane, ear canal and external ear normal.     Nose: Nose normal. No congestion or rhinorrhea.     Mouth/Throat:     Mouth: Mucous membranes are moist.     Pharynx: No oropharyngeal exudate or posterior oropharyngeal erythema.  Eyes:     Extraocular Movements: Extraocular movements intact.     Conjunctiva/sclera: Conjunctivae normal.     Pupils: Pupils are equal, round, and reactive to light.  Cardiovascular:     Rate and Rhythm: Normal rate and regular rhythm.     Pulses: Normal pulses.     Heart sounds: Normal heart sounds. No murmur heard. Pulmonary:     Effort: Pulmonary effort is normal. No respiratory distress.     Breath sounds: Normal breath sounds. No wheezing, rhonchi or rales.  Abdominal:     General: Abdomen is flat. Bowel sounds are normal. There is no distension.     Palpations: Abdomen is soft. There is no mass.     Tenderness: There is no abdominal tenderness. There  is no guarding or rebound.     Hernia: No hernia is present.  Genitourinary:    Comments: Pt declined genital exam. Musculoskeletal:        General: Normal range of motion.     Cervical back: Normal range of motion.     Right lower leg: No edema.     Left lower leg: No edema.  Skin:    General: Skin is warm and dry.     Findings: No rash.  Neurological:     General: No focal deficit present.     Mental Status: He is alert and oriented to person, place, and time.     Cranial Nerves: No cranial nerve deficit.     Motor: No weakness.     Gait: Gait normal.  Psychiatric:        Mood and Affect: Mood normal.        Behavior: Behavior normal.        Thought Content: Thought content normal.         Judgment: Judgment normal.     Assessment and Plan   1. Routine general medical examination at a health care facility - Lipid panel  2. Essential hypertension  3. Elevated blood pressure reading without diagnosis of hypertension   Wellness exam- stable. Reviewed vaccines and pt not wanting to get any HIV screening or Hep C screening.  Pt has h/o anxiety, normally blood pressures are controlled.  Will cont to monitor and recheck on next visit.  Pt was dx in chart with htn in 2019.  Pt not on medications.  Recommended getting lipid panel rechecked. Had slight elevation in 2018, of cholesterol and LDL.  BP Readings from Last 3 Encounters:  01/12/21 (!) 154/87  12/22/20 118/80  09/23/20 126/82   Return in about 1 year (around 01/12/2022) for wellness exam..  02/03/2021

## 2021-01-20 NOTE — Telephone Encounter (Signed)
Nurses  It is possible that he is developing hypertension.  Certainly healthy diet maximizing fruits and vegetables is the best approach. Fitting in some walking 20 to 30 minutes 4 times a week  I would recommend also patient do a follow-up visit with Dr. Ladona Ridgel next week if blood pressure is still elevated more than likely will start on medication. Thanks-Dr. Lorin Picket

## 2021-01-23 ENCOUNTER — Encounter: Payer: Self-pay | Admitting: Family Medicine

## 2021-01-25 ENCOUNTER — Other Ambulatory Visit (HOSPITAL_COMMUNITY): Payer: Self-pay

## 2021-01-25 MED ORDER — TELMISARTAN 20 MG PO TABS
20.0000 mg | ORAL_TABLET | Freq: Every day | ORAL | 1 refills | Status: DC
  Filled 2021-01-25: qty 90, 90d supply, fill #0

## 2021-01-26 ENCOUNTER — Other Ambulatory Visit (HOSPITAL_COMMUNITY): Payer: Self-pay

## 2021-02-08 ENCOUNTER — Other Ambulatory Visit (HOSPITAL_COMMUNITY): Payer: Self-pay

## 2021-02-09 ENCOUNTER — Other Ambulatory Visit (HOSPITAL_COMMUNITY): Payer: Self-pay

## 2021-02-10 ENCOUNTER — Other Ambulatory Visit (HOSPITAL_COMMUNITY): Payer: Self-pay

## 2021-02-10 MED ORDER — TELMISARTAN 40 MG PO TABS
40.0000 mg | ORAL_TABLET | Freq: Every day | ORAL | 1 refills | Status: DC
  Filled 2021-02-10: qty 90, 90d supply, fill #0

## 2021-02-11 ENCOUNTER — Other Ambulatory Visit (HOSPITAL_COMMUNITY): Payer: Self-pay

## 2021-02-17 NOTE — Telephone Encounter (Signed)
Can take up to 4 weeks to get maximal results on this medication.   Continue to monitor it, and may need to switch to a different medication if not controlled.   Thx.   Dr. Ladona Ridgel

## 2021-03-02 ENCOUNTER — Other Ambulatory Visit (HOSPITAL_COMMUNITY): Payer: Self-pay

## 2021-03-02 MED ORDER — DILTIAZEM HCL ER COATED BEADS 180 MG PO CP24
180.0000 mg | ORAL_CAPSULE | Freq: Every day | ORAL | 1 refills | Status: DC
  Filled 2021-03-02: qty 30, 30d supply, fill #0

## 2021-04-13 ENCOUNTER — Ambulatory Visit: Payer: 59 | Admitting: Family Medicine

## 2021-04-13 ENCOUNTER — Other Ambulatory Visit: Payer: Self-pay

## 2021-04-13 ENCOUNTER — Encounter: Payer: Self-pay | Admitting: Family Medicine

## 2021-04-13 ENCOUNTER — Other Ambulatory Visit (HOSPITAL_COMMUNITY): Payer: Self-pay

## 2021-04-13 VITALS — BP 137/89 | HR 72 | Temp 97.9°F | Wt 176.0 lb

## 2021-04-13 DIAGNOSIS — F429 Obsessive-compulsive disorder, unspecified: Secondary | ICD-10-CM | POA: Diagnosis not present

## 2021-04-13 DIAGNOSIS — G4700 Insomnia, unspecified: Secondary | ICD-10-CM

## 2021-04-13 DIAGNOSIS — I10 Essential (primary) hypertension: Secondary | ICD-10-CM

## 2021-04-13 MED ORDER — SERTRALINE HCL 100 MG PO TABS
100.0000 mg | ORAL_TABLET | Freq: Every day | ORAL | 1 refills | Status: DC
  Filled 2021-04-13 – 2021-05-30 (×2): qty 90, 90d supply, fill #0

## 2021-04-13 MED ORDER — DILTIAZEM HCL ER COATED BEADS 240 MG PO CP24
240.0000 mg | ORAL_CAPSULE | Freq: Every day | ORAL | 0 refills | Status: DC
  Filled 2021-04-13: qty 90, 90d supply, fill #0

## 2021-04-13 MED ORDER — CLONAZEPAM 1 MG PO TABS
ORAL_TABLET | Freq: Every day | ORAL | 2 refills | Status: DC | PRN
  Filled 2021-04-13: qty 15, 15d supply, fill #0

## 2021-04-13 NOTE — Progress Notes (Signed)
Patient ID: Robert Walsh, male    DOB: [birth year], 40 y.o.   MRN: [id number]   Chief Complaint  Patient presents with   Hypertension   Subjective:    HPI  Pt having issues with blood pressure. Began around June. Pt states Robert Walsh has not any changes. Pt has been feeling fatigue but nothing out of the ordinary. Pt has been out of Diltiazem for about 2-3 days. Pt checking blood pressure daily at home.   HTN Pt compliant with BP meds, but ran out a few days ago. No SEs Denies chest pain, sob, LE swelling, or blurry vision.   Pt having some elevations a few wks ago and some of bp numbers have improved.  Average 130s/80s.  Was in the 140s/90s. Tried micardis wasn't keeping pressures down.  We changed it to diltiazem 154m cr, and then pt called back stating it's still not coming down and increased it to 2419mdiltiazem CR. Was on lisinopril 1022mn 2020.  Has h/o ocd and depression- taking zoloft 100m56mt used to be on 200mg47mot on any supplement- was on testosterone and weight lifting supplements since 1/22.  Home cuff was same as here in office.  Not had his bp med today or last few days. Taking it at night but checking bp in am slightly high in am.   Medical History [facility] past medical history of Depression, Insomnia, and Obsessive compulsive disorder.   Outpatient Encounter Medications as of 04/13/2021  Medication Sig   [DISCONTINUED] clonazePAM (KLONOPIN) 1 MG tablet TAKE 1 TABLET BY MOUTH DAILY AS NEEDED FOR ANXIETY   [DISCONTINUED] diltiazem (CARDIZEM CD) 180 MG 24 hr capsule Take 1 capsule (180 mg total) by mouth daily.   [DISCONTINUED] sertraline (ZOLOFT) 100 MG tablet Take 1 tablet (100 mg total) by mouth daily.   clonazePAM (KLONOPIN) 1 MG tablet TAKE 1 TABLET BY MOUTH DAILY AS NEEDED FOR ANXIETY   diltiazem (CARDIZEM CD) 240 MG 24 hr capsule Take 1 capsule (240 mg total) by mouth daily.   sertraline (ZOLOFT) 100 MG tablet Take 1 tablet (100 mg total) by  mouth daily.   No facility-administered encounter medications on file as of 04/13/2021.     Review of Systems  Constitutional:  Negative for chills and fever.  HENT:  Negative for congestion, rhinorrhea and sore throat.   Respiratory:  Negative for cough, shortness of breath and wheezing.   Cardiovascular:  Negative for chest pain and leg swelling.  Gastrointestinal:  Negative for abdominal pain, diarrhea, nausea and vomiting.  Genitourinary:  Negative for dysuria and frequency.  Skin:  Negative for rash.  Neurological:  Negative for dizziness, weakness and headaches.    Vitals BP 137/89   Pulse 72   Temp 97.9 F (36.6 C)   Wt 176 lb (79.8 kg)   SpO2 100%   BMI 25.25 kg/m   Objective:   Physical Exam Vitals and nursing note reviewed.  Constitutional:      General: Robert Walsh is not in acute distress.    Appearance: Normal appearance. Robert Walsh is not ill-appearing.  HENT:     Head: Normocephalic.  Eyes:     Extraocular Movements: Extraocular movements intact.     Conjunctiva/sclera: Conjunctivae normal.     Pupils: Pupils are equal, round, and reactive to light.  Cardiovascular:     Rate and Rhythm: Normal rate and regular rhythm.     Pulses: Normal pulses.     Heart sounds: Normal heart sounds.  No murmur heard. Pulmonary:     Effort: Pulmonary effort is normal.     Breath sounds: Normal breath sounds. No wheezing, rhonchi or rales.  Musculoskeletal:        General: Normal range of motion.     Right lower leg: No edema.     Left lower leg: No edema.  Skin:    General: Skin is warm and dry.     Findings: No rash.  Neurological:     General: No focal deficit present.     Mental Status: Robert Walsh is alert and oriented to person, place, and time.     Cranial Nerves: No cranial nerve deficit.  Psychiatric:        Mood and Affect: Mood normal.        Behavior: Behavior normal.        Thought Content: Thought content normal.        Judgment: Judgment normal.     Assessment and Plan    1. Essential hypertension - CMP14+EGFR - diltiazem (CARDIZEM CD) 240 MG 24 hr capsule; Take 1 capsule (240 mg total) by mouth daily.  Dispense: 90 capsule; Refill: 0  2. Obsessive-compulsive disorder, unspecified type - clonazePAM (KLONOPIN) 1 MG tablet; TAKE 1 TABLET BY MOUTH DAILY AS NEEDED FOR ANXIETY  Dispense: 15 tablet; Refill: 2 - sertraline (ZOLOFT) 100 MG tablet; Take 1 tablet (100 mg total) by mouth daily.  Dispense: 90 tablet; Refill: 1  3. Insomnia, unspecified type - clonazePAM (KLONOPIN) 1 MG tablet; TAKE 1 TABLET BY MOUTH DAILY AS NEEDED FOR ANXIETY  Dispense: 15 tablet; Refill: 2   Htn- suboptimal.  Pt not on bp medication today, stating ran out.  Checked his bp cuff in room with our and was same.  May have component of anxiety with his elevated bp readings. Cont to monitor, a few times per week, not to check daily.  Cont with cardizem 237m CD daily. If this is too high of dose, then may need to decrease back to 1859mcardizem CD.  Ocd/anxiety-  stable. Cont to take his zoloft and clonazepam prn.   Return in about 3 months (around 07/13/2021) for f/u bp.

## 2021-04-26 DIAGNOSIS — Z125 Encounter for screening for malignant neoplasm of prostate: Secondary | ICD-10-CM | POA: Diagnosis not present

## 2021-04-26 DIAGNOSIS — E291 Testicular hypofunction: Secondary | ICD-10-CM | POA: Diagnosis not present

## 2021-04-26 DIAGNOSIS — R5383 Other fatigue: Secondary | ICD-10-CM | POA: Diagnosis not present

## 2021-04-28 DIAGNOSIS — R6882 Decreased libido: Secondary | ICD-10-CM | POA: Diagnosis not present

## 2021-04-28 DIAGNOSIS — R5383 Other fatigue: Secondary | ICD-10-CM | POA: Diagnosis not present

## 2021-04-28 DIAGNOSIS — Z6826 Body mass index (BMI) 26.0-26.9, adult: Secondary | ICD-10-CM | POA: Diagnosis not present

## 2021-04-28 DIAGNOSIS — E291 Testicular hypofunction: Secondary | ICD-10-CM | POA: Diagnosis not present

## 2021-05-30 ENCOUNTER — Other Ambulatory Visit (HOSPITAL_COMMUNITY): Payer: Self-pay

## 2021-06-06 DIAGNOSIS — E291 Testicular hypofunction: Secondary | ICD-10-CM | POA: Diagnosis not present

## 2021-06-06 DIAGNOSIS — Z79890 Hormone replacement therapy (postmenopausal): Secondary | ICD-10-CM | POA: Diagnosis not present

## 2021-06-06 DIAGNOSIS — R5383 Other fatigue: Secondary | ICD-10-CM | POA: Diagnosis not present

## 2021-06-14 DIAGNOSIS — E291 Testicular hypofunction: Secondary | ICD-10-CM | POA: Diagnosis not present

## 2021-06-14 DIAGNOSIS — Z6826 Body mass index (BMI) 26.0-26.9, adult: Secondary | ICD-10-CM | POA: Diagnosis not present

## 2021-06-14 DIAGNOSIS — M2550 Pain in unspecified joint: Secondary | ICD-10-CM | POA: Diagnosis not present

## 2021-06-14 DIAGNOSIS — R5383 Other fatigue: Secondary | ICD-10-CM | POA: Diagnosis not present

## 2021-07-28 ENCOUNTER — Other Ambulatory Visit (HOSPITAL_COMMUNITY): Payer: Self-pay

## 2021-07-28 ENCOUNTER — Other Ambulatory Visit: Payer: Self-pay | Admitting: Family Medicine

## 2021-07-28 ENCOUNTER — Telehealth: Payer: Self-pay | Admitting: Family Medicine

## 2021-07-28 DIAGNOSIS — I10 Essential (primary) hypertension: Secondary | ICD-10-CM

## 2021-07-28 MED ORDER — DILTIAZEM HCL ER COATED BEADS 240 MG PO CP24
240.0000 mg | ORAL_CAPSULE | Freq: Every day | ORAL | 1 refills | Status: DC
  Filled 2021-07-28: qty 90, 90d supply, fill #0

## 2021-07-28 NOTE — Telephone Encounter (Signed)
Left message on voicemail.

## 2021-07-28 NOTE — Telephone Encounter (Signed)
Patient is wanting refill on diltiazem 240 mg last filled 04/13/21 when he was seen explain to patient would need to see new provider before prescription could be filled and you would give him enough until appointment it wouldn't be fill prescription his appointment is 08/04/21. [facility]

## 2021-07-28 NOTE — Telephone Encounter (Signed)
Please advise. Thank you

## 2021-07-29 ENCOUNTER — Other Ambulatory Visit (HOSPITAL_COMMUNITY): Payer: Self-pay

## 2021-08-04 ENCOUNTER — Ambulatory Visit: Payer: 59 | Admitting: Family Medicine

## 2021-08-10 ENCOUNTER — Ambulatory Visit (INDEPENDENT_AMBULATORY_CARE_PROVIDER_SITE_OTHER): Payer: 59 | Admitting: Family Medicine

## 2021-08-10 ENCOUNTER — Encounter: Payer: Self-pay | Admitting: Family Medicine

## 2021-08-10 ENCOUNTER — Other Ambulatory Visit: Payer: Self-pay

## 2021-08-10 VITALS — BP 141/93 | HR 74 | Temp 98.3°F | Wt 186.0 lb

## 2021-08-10 DIAGNOSIS — I10 Essential (primary) hypertension: Secondary | ICD-10-CM | POA: Diagnosis not present

## 2021-08-10 DIAGNOSIS — F419 Anxiety disorder, unspecified: Secondary | ICD-10-CM

## 2021-08-10 DIAGNOSIS — F429 Obsessive-compulsive disorder, unspecified: Secondary | ICD-10-CM

## 2021-08-10 MED ORDER — AMLODIPINE BESYLATE 10 MG PO TABS: 10.0000 mg | ORAL_TABLET | Freq: Every day | ORAL | 1 refills | Status: DC

## 2021-08-10 NOTE — Progress Notes (Signed)
Subjective:  Patient ID: Robert Walsh, male    DOB: 22-Jul-1981  Age: 41 y.o. MRN: [id number]  CC: Chief Complaint  Patient presents with   Establish Care    HPI:  41 year old male with hypertension, OCD, anxiety presents for follow-up.  Patient has hypertension.  He states that his blood pressures continue to be elevated.  BP elevated today.  His home readings are elevated as well.  He is on diltiazem.  I am not sure why he is on diltiazem as he has no compelling indication for use of this medication.  He states that he has been on telmisartan previously.  He is concerned as his pressures are not controlled.  Regards to his OCD and anxiety, he is doing well.  He is on Zoloft and clonazepam.  Patient Active Problem List   Diagnosis Date Noted   Anxiety 08/10/2021   History of nephrolithiasis 06/14/2020   Low back pain 03/17/2020   Essential hypertension 02/28/2018   OCD (obsessive compulsive disorder) 03/02/2013    Social Hx   Social History   Socioeconomic History   Marital status: Married    Spouse name: Not on file   Number of children: Not on file   Years of education: Not on file   Highest education level: Not on file  Occupational History   Not on file  Tobacco Use   Smoking status: Former   Smokeless tobacco: Former  Substance and Sexual Activity   Alcohol use: Not on file   Drug use: Not on file   Sexual activity: Not on file  Other Topics Concern   Not on file  Social History Narrative   Not on file   Social Determinants of Health   Financial Resource Strain: Not on file  Food Insecurity: Not on file  Transportation Needs: Not on file  Physical Activity: Not on file  Stress: Not on file  Social Connections: Not on file    Review of Systems  Constitutional: Negative.   Cardiovascular: Negative.     Objective:  BP (!) 141/93    Pulse 74    Temp 98.3 F (36.8 C)    Wt 186 lb (84.4 kg)    SpO2 99%    BMI 26.69 kg/m   BP/Weight 08/10/2021  04/13/2021 01/12/2021  Systolic BP 141 137 154  Diastolic BP 93 89 87  Wt. (Lbs) 186 176 182  BMI 26.69 25.25 26.11    Physical Exam Constitutional:      General: He is not in acute distress.    Appearance: Normal appearance. He is not ill-appearing.  HENT:     Head: Normocephalic and atraumatic.  Cardiovascular:     Rate and Rhythm: Normal rate and regular rhythm.     Heart sounds: No murmur heard. Pulmonary:     Effort: Pulmonary effort is normal.     Breath sounds: Normal breath sounds. No wheezing, rhonchi or rales.  Neurological:     Mental Status: He is alert.  Psychiatric:        Mood and Affect: Mood normal.        Behavior: Behavior normal.    Lab Results  Component Value Date   WBC 5.6 07/05/2020   HGB 15.6 07/05/2020   HCT 46.0 07/05/2020   PLT 197 07/05/2020   GLUCOSE 92 07/05/2020   CHOL 223 (H) 12/11/2016   TRIG 76 12/11/2016   HDL 61 12/11/2016   LDLCALC 147 (H) 12/11/2016   ALT 31 07/05/2020  AST 28 07/05/2020   NA 140 07/05/2020   K 4.7 07/05/2020   CL 102 07/05/2020   CREATININE 1.09 07/05/2020   BUN 18 07/05/2020   CO2 25 07/05/2020     Assessment & Plan:   Problem List Items Addressed This Visit       Cardiovascular and Mediastinum   Essential hypertension - Primary    Not at goal.  Stopping diltiazem.  Starting amlodipine.  Follow-up in 1 month.      Relevant Medications   amLODipine (NORVASC) 10 MG tablet     Other   OCD (obsessive compulsive disorder)    Stable.  Continue Zoloft.      Anxiety    Stable.  Continue Klonopin.       Meds ordered this encounter  Medications   amLODipine (NORVASC) 10 MG tablet    Sig: Take 1 tablet (10 mg total) by mouth daily.    Dispense:  90 tablet    Refill:  1    Follow-up:  Return in about 1 month (around 09/10/2021).  Everlene Other DO [facility]

## 2021-08-10 NOTE — Assessment & Plan Note (Signed)
Stable Continue Klonopin 

## 2021-08-10 NOTE — Assessment & Plan Note (Signed)
-  Stable -Continue Zoloft 

## 2021-08-10 NOTE — Assessment & Plan Note (Signed)
Not at goal.  Stopping diltiazem.  Starting amlodipine.  Follow-up in 1 month.

## 2021-08-10 NOTE — Patient Instructions (Signed)
Check your BP at least a few times a week.  Norvasc daily.  Follow up in 1 month.

## 2021-08-19 ENCOUNTER — Encounter: Payer: Self-pay | Admitting: Family Medicine

## 2021-08-24 ENCOUNTER — Encounter: Payer: Self-pay | Admitting: Family Medicine

## 2021-08-24 ENCOUNTER — Other Ambulatory Visit: Payer: Self-pay | Admitting: Family Medicine

## 2021-08-24 ENCOUNTER — Other Ambulatory Visit (HOSPITAL_COMMUNITY): Payer: Self-pay

## 2021-08-24 MED ORDER — HYDROCHLOROTHIAZIDE 25 MG PO TABS
25.0000 mg | ORAL_TABLET | Freq: Every day | ORAL | 1 refills | Status: DC
  Filled 2021-08-24: qty 90, 90d supply, fill #0

## 2021-08-31 ENCOUNTER — Encounter: Payer: Self-pay | Admitting: Family Medicine

## 2021-09-01 ENCOUNTER — Ambulatory Visit: Payer: 59 | Admitting: Family Medicine

## 2021-09-01 ENCOUNTER — Encounter: Payer: Self-pay | Admitting: Family Medicine

## 2021-09-01 ENCOUNTER — Other Ambulatory Visit: Payer: Self-pay

## 2021-09-01 VITALS — BP 140/88 | HR 63 | Temp 97.7°F | Wt 185.2 lb

## 2021-09-01 DIAGNOSIS — Z13220 Encounter for screening for lipoid disorders: Secondary | ICD-10-CM | POA: Diagnosis not present

## 2021-09-01 DIAGNOSIS — I10 Essential (primary) hypertension: Secondary | ICD-10-CM | POA: Diagnosis not present

## 2021-09-01 DIAGNOSIS — Z130 Encounter for screening for diseases of the blood and blood-forming organs and certain disorders involving the immune mechanism: Secondary | ICD-10-CM

## 2021-09-01 NOTE — Progress Notes (Signed)
Subjective:  Patient ID: Robert Walsh, male    DOB: 02/01/1981  Age: 41 y.o. MRN: [id number]  CC: Chief Complaint  Patient presents with   Hypertension    HPI:  41 year old male presents for follow-up regarding hypertension.  Patient reports that his blood pressures remain elevated at home despite Norvasc 10 mg daily and HCTZ 25 mg daily.  He endorses compliance.  He checks his blood pressure in the morning with his feet flat on the floor before caffeine and with an empty bladder.  He is using an appropriately sized cuff.  Recent blood pressure readings are in epic.  He sent a message with blood pressure reading of 167/91 and also 161/81.  His blood pressure is fairly well controlled here.  Repeat reading was 138/88.  Patient has previously been on a diltiazem, ACE inhibitor, ARB, and his current medication.  He has had hypertension for about 4 years.  He is currently on testosterone treatment through [facility].  Patient Active Problem List   Diagnosis Date Noted   Anxiety 08/10/2021   History of nephrolithiasis 06/14/2020   Low back pain 03/17/2020   Essential hypertension 02/28/2018   OCD (obsessive compulsive disorder) 03/02/2013    Social Hx   Social History   Socioeconomic History   Marital status: Married    Spouse name: Not on file   Number of children: Not on file   Years of education: Not on file   Highest education level: Not on file  Occupational History   Not on file  Tobacco Use   Smoking status: Former   Smokeless tobacco: Former  Substance and Sexual Activity   Alcohol use: Not on file   Drug use: Not on file   Sexual activity: Not on file  Other Topics Concern   Not on file  Social History Narrative   Not on file   Social Determinants of Health   Financial Resource Strain: Not on file  Food Insecurity: Not on file  Transportation Needs: Not on file  Physical Activity: Not on file  Stress: Not on file  Social Connections: Not on file     Review of Systems  Respiratory: Negative.    Cardiovascular: Negative.   Psychiatric/Behavioral:  The patient is nervous/anxious.     Objective:  BP 140/88    Pulse 63    Temp 97.7 F (36.5 C)    Wt 185 lb 3.2 oz (84 kg)    SpO2 98%    BMI 26.57 kg/m   BP/Weight 09/01/2021 03/10/4195 09/08/2977  Systolic BP 892 119 417  Diastolic BP 88 93 89  Wt. (Lbs) 185.2 186 176  BMI 26.57 26.69 25.25    Physical Exam Vitals and nursing note reviewed.  Constitutional:      General: He is not in acute distress.    Appearance: Normal appearance. He is not ill-appearing.  HENT:     Head: Normocephalic and atraumatic.  Cardiovascular:     Rate and Rhythm: Normal rate and regular rhythm.  Pulmonary:     Effort: Pulmonary effort is normal.     Breath sounds: Normal breath sounds. No wheezing, rhonchi or rales.  Neurological:     Mental Status: He is alert.  Psychiatric:        Mood and Affect: Mood normal.        Behavior: Behavior normal.    Lab Results  Component Value Date   WBC 5.6 07/05/2020   HGB 15.6 07/05/2020   HCT 46.0  07/05/2020   PLT 197 07/05/2020   GLUCOSE 92 07/05/2020   CHOL 223 (H) 12/11/2016   TRIG 76 12/11/2016   HDL 61 12/11/2016   LDLCALC 147 (H) 12/11/2016   ALT 31 07/05/2020   AST 28 07/05/2020   NA 140 07/05/2020   K 4.7 07/05/2020   CL 102 07/05/2020   CREATININE 1.09 07/05/2020   BUN 18 07/05/2020   CO2 25 07/05/2020     Assessment & Plan:   Problem List Items Addressed This Visit       Cardiovascular and Mediastinum   Essential hypertension - Primary    BP fairly well controlled.  Goal less than 130/80.  He is compliant with HCTZ and amlodipine.  I suspect that his home meter is inaccurate.  He is very anxious and troubled by his elevated BP readings and the fact that his blood pressure continues to be slightly elevated on pharmacotherapy.  Advised to get a new blood pressure monitor.  Laboratory studies today.  He will continue his current  medication.  We will consider addition of ARB on follow-up.  I also discussed the potential for seeing the hypertension clinic for a specialty point of view.  He will consider this.      Relevant Orders   CMP14+EGFR   Microalbumin / creatinine urine ratio   TSH   Aldosterone + renin activity w/ ratio   Other Visit Diagnoses     Screening, lipid       Relevant Orders   Lipid panel   Screening for deficiency anemia       Relevant Orders   CBC      Follow-up: Has follow-up in early February  Artasia Thang [place]

## 2021-09-01 NOTE — Patient Instructions (Signed)
Labs in the morning.  New cuff.  Continue your current medications.

## 2021-09-01 NOTE — Assessment & Plan Note (Signed)
BP fairly well controlled.  Goal less than 130/80.  He is compliant with HCTZ and amlodipine.  I suspect that his home meter is inaccurate.  He is very anxious and troubled by his elevated BP readings and the fact that his blood pressure continues to be slightly elevated on pharmacotherapy.  Advised to get a new blood pressure monitor.  Laboratory studies today.  He will continue his current medication.  We will consider addition of ARB on follow-up.  I also discussed the potential for seeing the hypertension clinic for a specialty point of view.  He will consider this.

## 2021-09-08 LAB — CMP14+EGFR
ALT: 42 IU/L (ref 0–44)
AST: 31 IU/L (ref 0–40)
Albumin/Globulin Ratio: 2.3 — ABNORMAL HIGH (ref 1.2–2.2)
Albumin: 4.6 g/dL (ref 4.0–5.0)
Alkaline Phosphatase: 71 IU/L (ref 44–121)
BUN/Creatinine Ratio: 14 (ref 9–20)
BUN: 15 mg/dL (ref 6–20)
Bilirubin Total: 0.5 mg/dL (ref 0.0–1.2)
CO2: 28 mmol/L (ref 20–29)
Calcium: 9.5 mg/dL (ref 8.7–10.2)
Chloride: 100 mmol/L (ref 96–106)
Creatinine, Ser: 1.09 mg/dL (ref 0.76–1.27)
Globulin, Total: 2 g/dL (ref 1.5–4.5)
Glucose: 95 mg/dL (ref 70–99)
Potassium: 4.1 mmol/L (ref 3.5–5.2)
Sodium: 138 mmol/L (ref 134–144)
Total Protein: 6.6 g/dL (ref 6.0–8.5)
eGFR: 89 mL/min/{1.73_m2} (ref >59)

## 2021-09-08 LAB — TSH: TSH: 3.1 u[IU]/mL (ref 0.450–4.500)

## 2021-09-08 LAB — MICROALBUMIN / CREATININE URINE RATIO
Creatinine, Urine: 99.1 mg/dL
Microalb/Creat Ratio: <3 mg/g creat (ref 0–29)
Microalbumin, Urine: <3 ug/mL

## 2021-09-08 LAB — LIPID PANEL
Chol/HDL Ratio: 3.4 ratio (ref 0.0–5.0)
Cholesterol, Total: 198 mg/dL (ref 100–199)
HDL: 58 mg/dL (ref >39)
LDL Chol Calc (NIH): 125 mg/dL — ABNORMAL HIGH (ref 0–99)
Triglycerides: 85 mg/dL (ref 0–149)
VLDL Cholesterol Cal: 15 mg/dL (ref 5–40)

## 2021-09-08 LAB — CBC
Hematocrit: 46.8 % (ref 37.5–51.0)
Hemoglobin: 16.3 g/dL (ref 13.0–17.7)
MCH: 30.8 pg (ref 26.6–33.0)
MCHC: 34.8 g/dL (ref 31.5–35.7)
MCV: 89 fL (ref 79–97)
Platelets: 231 10*3/uL (ref 150–450)
RBC: 5.29 x10E6/uL (ref 4.14–5.80)
RDW: 13.1 % (ref 11.6–15.4)
WBC: 6.7 10*3/uL (ref 3.4–10.8)

## 2021-09-08 LAB — ALDOSTERONE + RENIN ACTIVITY W/ RATIO
ALDOS/RENIN RATIO: 6.2 (ref 0.0–30.0)
ALDOSTERONE: 8.1 ng/dL (ref 0.0–30.0)
Renin: 1.299 ng/mL/hr (ref 0.167–5.380)

## 2021-09-12 ENCOUNTER — Ambulatory Visit: Payer: 59 | Admitting: Family Medicine

## 2021-09-12 ENCOUNTER — Other Ambulatory Visit: Payer: Self-pay

## 2021-09-12 DIAGNOSIS — I10 Essential (primary) hypertension: Secondary | ICD-10-CM

## 2021-09-12 NOTE — Progress Notes (Signed)
Subjective:  Patient ID: Robert Walsh, male    DOB: June 18, 1981  Age: 41 y.o. MRN: [id number]  CC: Chief Complaint  Patient presents with   Hypertension    HPI:  41 year old male presents for follow-up regarding hypertension.  Hypertension is now markedly improved.  BP is the best that has been, 122/83.  He is compliant with amlodipine and HCTZ.  He has not yet gotten the new cuff at home.  Recent labs were reviewed.  No evidence of hyperaldosteronism.  Patient Active Problem List   Diagnosis Date Noted   Anxiety 08/10/2021   History of nephrolithiasis 06/14/2020   Low back pain 03/17/2020   Essential hypertension 02/28/2018   OCD (obsessive compulsive disorder) 03/02/2013    Social Hx   Social History   Socioeconomic History   Marital status: Married    Spouse name: Not on file   Number of children: Not on file   Years of education: Not on file   Highest education level: Not on file  Occupational History   Not on file  Tobacco Use   Smoking status: Former   Smokeless tobacco: Former  Substance and Sexual Activity   Alcohol use: Not on file   Drug use: Not on file   Sexual activity: Not on file  Other Topics Concern   Not on file  Social History Narrative   Not on file   Social Determinants of Health   Financial Resource Strain: Not on file  Food Insecurity: Not on file  Transportation Needs: Not on file  Physical Activity: Not on file  Stress: Not on file  Social Connections: Not on file    Review of Systems  Constitutional: Negative.   Psychiatric/Behavioral:  The patient is nervous/anxious.    Objective:  BP 122/83    Pulse 83    Temp 98.6 F (37 C) (Oral)    Ht 5\' 10"  (1.778 m)    Wt 187 lb (84.8 kg)    SpO2 100%    BMI 26.83 kg/m   BP/Weight 09/12/2021 09/01/2021 08/10/2021  Systolic BP 122 140 141  Diastolic BP 83 88 93  Wt. (Lbs) 187 185.2 186  BMI 26.83 26.57 26.69    Physical Exam Vitals and nursing note reviewed.  Constitutional:       General: He is not in acute distress.    Appearance: Normal appearance. He is not ill-appearing.  Cardiovascular:     Rate and Rhythm: Normal rate and regular rhythm.  Pulmonary:     Effort: Pulmonary effort is normal.     Breath sounds: Normal breath sounds. No wheezing, rhonchi or rales.  Neurological:     Mental Status: He is alert.    Lab Results  Component Value Date   WBC 6.7 09/02/2021   HGB 16.3 09/02/2021   HCT 46.8 09/02/2021   PLT 231 09/02/2021   GLUCOSE 95 09/02/2021   CHOL 198 09/02/2021   TRIG 85 09/02/2021   HDL 58 09/02/2021   LDLCALC 125 (H) 09/02/2021   ALT 42 09/02/2021   AST 31 09/02/2021   NA 138 09/02/2021   K 4.1 09/02/2021   CL 100 09/02/2021   CREATININE 1.09 09/02/2021   BUN 15 09/02/2021   CO2 28 09/02/2021   TSH 3.100 09/02/2021     Assessment & Plan:   Problem List Items Addressed This Visit       Cardiovascular and Mediastinum   Essential hypertension    BP is well controlled this time.  Continue amlodipine and HCTZ.  Keep a close eye on blood pressures at home.       Follow-up:  Return in about 6 months (around 03/12/2022).  Everlene Other DO [facility]

## 2021-09-12 NOTE — Assessment & Plan Note (Signed)
BP is well controlled this time.  Continue amlodipine and HCTZ.  Keep a close eye on blood pressures at home.

## 2021-09-14 ENCOUNTER — Encounter: Payer: Self-pay | Admitting: Family Medicine

## 2021-11-29 ENCOUNTER — Other Ambulatory Visit: Payer: Self-pay | Admitting: Family Medicine

## 2021-11-29 DIAGNOSIS — F429 Obsessive-compulsive disorder, unspecified: Secondary | ICD-10-CM

## 2022-01-09 ENCOUNTER — Ambulatory Visit
Admission: RE | Admit: 2022-01-09 | Discharge: 2022-01-09 | Disposition: A | Discharge status: Home / Self Care | Payer: 59 | Source: Ambulatory Visit | Attending: Nurse Practitioner | Admitting: Nurse Practitioner

## 2022-01-09 VITALS — BP 150/85 | HR 55 | Temp 97.8°F | Resp 18

## 2022-01-09 DIAGNOSIS — I10 Essential (primary) hypertension: Secondary | ICD-10-CM | POA: Insufficient documentation

## 2022-01-09 DIAGNOSIS — R350 Frequency of micturition: Secondary | ICD-10-CM | POA: Diagnosis present

## 2022-01-09 LAB — POCT URINALYSIS DIP (MANUAL ENTRY)
Bilirubin, UA: NEGATIVE
Blood, UA: NEGATIVE
Glucose, UA: NEGATIVE mg/dL
Ketones, POC UA: NEGATIVE mg/dL
Leukocytes, UA: NEGATIVE
Nitrite, UA: NEGATIVE
Protein Ur, POC: NEGATIVE mg/dL
Spec Grav, UA: 1.015 (ref 1.010–1.025)
Urobilinogen, UA: 0.2 E.U./dL
pH, UA: 7.5 (ref 5.0–8.0)

## 2022-01-09 NOTE — ED Provider Notes (Signed)
[facility]    CSN: [id number] Arrival date & time: 01/09/22  1210      History   Chief Complaint Chief Complaint  Patient presents with   Urinary Frequency    Entered by patient    HPI Robert Walsh is a 41 y.o. male.   Patient presents for urinary frequency that has been worsening for the last few days.  He reports a longstanding history (years) of urinary frequency.  He denies fevers, dysuria, abdominal pain, flank pain, and nausea/vomiting.  No pain with bowel movements, change in bowel movements, change in urine color or odor, or blood in urine.  He endorses voiding smaller amounts.  He is worried he may have diabetes.  Has not tried anything for his symptoms.  Reports he recently started citrus bergamot supplement.    Also reports ongoing issues with elevated blood pressure.  He was following closely with a primary care provider, however is looking for a new primary care provider.  He reports the blood pressure medication made him feel bad, so he stopped taking it.  He denies any vision changes, blurred vision or double vision, chest pain, shortness of breath, lower extremity swelling.  Medical history significant for OCD, anxiety, and hypertension.     Past Medical History:  Diagnosis Date   Depression    Insomnia    Obsessive compulsive disorder     Patient Active Problem List   Diagnosis Date Noted   Anxiety 08/10/2021   History of nephrolithiasis 06/14/2020   Low back pain 03/17/2020   Essential hypertension 02/28/2018   OCD (obsessive compulsive disorder) 03/02/2013    History reviewed. No pertinent surgical history.     Home Medications    Prior to Admission medications   Medication Sig Start Date End Date Taking? Authorizing Provider  Multiple Vitamin (MULTIVITAMIN) tablet Take 1 tablet by mouth daily.   Yes [provider]  amLODipine (NORVASC) 10 MG tablet Take 1 tablet (10 mg total) by mouth daily. 08/10/21   Tommie Samsook, Jayce G, DO   clonazePAM (KLONOPIN) 1 MG tablet TAKE 1 TABLET BY MOUTH DAILY AS NEEDED FOR ANXIETY 04/13/21 10/10/21  Laroy Appleaylor, Malena M, DO  hydrochlorothiazide (HYDRODIURIL) 25 MG tablet Take 1 tablet (25 mg total) by mouth daily. 08/24/21   Tommie Samsook, Jayce G, DO  sertraline (ZOLOFT) 100 MG tablet TAKE TWO TABLETS (200MG  TOTAL) BY MOUTH DAILY 11/29/21   Tommie Samsook, Jayce G, DO    Family History Family History  Problem Relation Age of Onset   Healthy Mother    Hypertension Father     Social History Social History   Tobacco Use   Smoking status: Former   Smokeless tobacco: Former  Substance Use Topics   Alcohol use: Never   Drug use: Never     Allergies   Patient has no known allergies.   Review of Systems Review of Systems Per HPI  Physical Exam Triage Vital Signs ED Triage Vitals  Enc Vitals Group     BP 01/09/22 1326 (!) 150/85     Pulse Rate 01/09/22 1326 (!) 55     Resp 01/09/22 1326 18     Temp 01/09/22 1326 97.8 F (36.6 C)     Temp Source 01/09/22 1326 Oral     SpO2 01/09/22 1326 98 %     Weight --      Height --      Head Circumference --      Peak Flow --  Pain Score 01/09/22 1325 0     Pain Loc --      Pain Edu? --      Excl. in GC? --    No data found.  Updated Vital Signs BP (!) 150/85 (BP Location: Right Arm)   Pulse (!) 55   Temp 97.8 F (36.6 C) (Oral)   Resp 18   SpO2 98%   Visual Acuity Right Eye Distance:   Left Eye Distance:   Bilateral Distance:    Right Eye Near:   Left Eye Near:    Bilateral Near:     Physical Exam Vitals and nursing note reviewed.  Constitutional:      General: He is not in acute distress.    Appearance: Normal appearance. He is not toxic-appearing.  HENT:     Head: Normocephalic.     Mouth/Throat:     Mouth: Mucous membranes are moist.     Pharynx: Oropharynx is clear.  Cardiovascular:     Rate and Rhythm: Regular rhythm. Bradycardia present.  Pulmonary:     Effort: Pulmonary effort is normal. No respiratory distress.      Breath sounds: Normal breath sounds. No wheezing, rhonchi or rales.  Abdominal:     General: Abdomen is flat. Bowel sounds are normal. There is no distension.     Palpations: Abdomen is soft. There is no mass.     Tenderness: There is no abdominal tenderness. There is no right CVA tenderness, left CVA tenderness or guarding.  Genitourinary:    Comments: Deferred Skin:    General: Skin is warm and dry.     Coloration: Skin is not jaundiced or pale.     Findings: No erythema.  Neurological:     Mental Status: He is alert and oriented to person, place, and time.  Psychiatric:        Behavior: Behavior is cooperative.     UC Treatments / Results  Labs (all labs ordered are listed, but only abnormal results are displayed) Labs Reviewed  URINE CULTURE  POCT URINALYSIS DIP (MANUAL ENTRY)    EKG   Radiology No results found.  Procedures Procedures (including critical care time)  Medications Ordered in UC Medications - No data to display  Initial Impression / Assessment and Plan / UC Course  I have reviewed the triage vital signs and the nursing notes.  Pertinent labs & imaging results that were available during my care of the patient were reviewed by me and considered in my medical decision making (see chart for details).    We discussed that urine sample today does not show any signs of an acute infection and is negative for glucose.  We discussed limiting bladder irritants such as citrus fruits, caffeine, spicy foods, tomatoes.  Seek care if symptoms persist or worsen.  Encouraged follow-up with primary care provider regarding ongoing hypertension; he is not having any symptoms today, however blood pressure is elevated above goal.  He was previously intolerant of amlodipine and hydrochlorothiazide.  We will provide primary care assistance today. Final Clinical Impressions(s) / UC Diagnoses   Final diagnoses:  Urinary frequency  Essential hypertension      Discharge Instructions      - The urine sample today does not show glucose or signs of infection  - Please try to avoid caffeine, acidic foods, and citrus foods/supplements for the next few days and see if this helps.  Also, try to increase water intake.  - We will assist with establishing with a  new primary care provider; you will hear from [place] to set up an appointment     ED Prescriptions   None    PDMP not reviewed this encounter.   Valentino Nose, NP 01/09/22 [phone]

## 2022-01-09 NOTE — Discharge Instructions (Addendum)
-   The urine sample today does not show glucose or signs of infection  - Please try to avoid caffeine, acidic foods, and citrus foods/supplements for the next few days and see if this helps.  Also, try to increase water intake.  - We will assist with establishing with a new primary care provider; you will hear from [place] to set up an appointment

## 2022-01-09 NOTE — ED Triage Notes (Signed)
Pt reports increased urinary frequency x 5 years, worse in the past 3-4 days. States he has checked for this before without relief.   Pt reports on and off high blood pressure x 1 year.

## 2022-01-10 LAB — URINE CULTURE: Culture: NO GROWTH

## 2022-02-03 ENCOUNTER — Other Ambulatory Visit: Payer: Self-pay | Admitting: Family Medicine

## 2022-02-03 DIAGNOSIS — F429 Obsessive-compulsive disorder, unspecified: Secondary | ICD-10-CM

## 2022-03-13 ENCOUNTER — Ambulatory Visit: Payer: Self-pay | Admitting: Family Medicine

## 2022-04-06 ENCOUNTER — Encounter: Payer: Self-pay | Admitting: Internal Medicine

## 2022-04-06 ENCOUNTER — Ambulatory Visit (INDEPENDENT_AMBULATORY_CARE_PROVIDER_SITE_OTHER): Payer: 59 | Admitting: Internal Medicine

## 2022-04-06 VITALS — BP 130/80 | HR 57 | Ht 69.0 in | Wt 178.4 lb

## 2022-04-06 DIAGNOSIS — I10 Essential (primary) hypertension: Secondary | ICD-10-CM

## 2022-04-06 DIAGNOSIS — E291 Testicular hypofunction: Secondary | ICD-10-CM | POA: Diagnosis not present

## 2022-04-06 DIAGNOSIS — Z0000 Encounter for general adult medical examination without abnormal findings: Secondary | ICD-10-CM | POA: Insufficient documentation

## 2022-04-06 DIAGNOSIS — F419 Anxiety disorder, unspecified: Secondary | ICD-10-CM

## 2022-04-06 MED ORDER — TELMISARTAN 40 MG PO TABS: 40.0000 mg | ORAL_TABLET | Freq: Every day | ORAL | 2 refills | Status: AC

## 2022-04-06 NOTE — Assessment & Plan Note (Signed)
Well-controlled on Zoloft.  He has previously been prescribed Klonopin as well, last refilled in September 2022 per PDMP.  He has not needed to take Klonopin recently and is in agreement with discontinuing the prescription today.

## 2022-04-06 NOTE — Progress Notes (Signed)
New Patient Office Visit  Subjective    Patient ID: Robert Walsh, male    DOB: 03/09/1981  Age: 41 y.o. MRN: [id number]  CC:  Chief Complaint  Patient presents with   Establish Care   HPI Robert Walsh presents to establish care.  He is a 41 year old male with past medical history significant for hypertension, hyperlipidemia, anxiety, and former tobacco use.  He was previously followed by regional family medicine, last seen in February 2023.  Today Robert Walsh states that he is feeling well.  He is asymptomatic currently and denies acute concerns.  Chronic medical issues and outstanding preventative healthcare maintenance items discussed today are individually addressed in A/P below.  Outpatient Encounter Medications as of 04/06/2022  Medication Sig   telmisartan (MICARDIS) 40 MG tablet Take 1 tablet (40 mg total) by mouth daily.   Multiple Vitamin (MULTIVITAMIN) tablet Take 1 tablet by mouth daily. (Patient not taking: Reported on 04/06/2022)   sertraline (ZOLOFT) 100 MG tablet TAKE TWO TABLETS ($RemoveBefo'200MG'[id number]$  TOTAL) BY MOUTH DAILY (Patient not taking: Reported on 04/06/2022)   [DISCONTINUED] amLODipine (NORVASC) 10 MG tablet Take 1 tablet (10 mg total) by mouth daily. (Patient not taking: Reported on 04/06/2022)   [DISCONTINUED] clonazePAM (KLONOPIN) 1 MG tablet TAKE 1 TABLET BY MOUTH DAILY AS NEEDED FOR ANXIETY   [DISCONTINUED] hydrochlorothiazide (HYDRODIURIL) 25 MG tablet Take 1 tablet (25 mg total) by mouth daily. (Patient not taking: Reported on 04/06/2022)   No facility-administered encounter medications on file as of 04/06/2022.   Past Medical History:  Diagnosis Date   Depression    Insomnia    Obsessive compulsive disorder    History reviewed. No pertinent surgical history.  Family History  Problem Relation Age of Onset   Healthy Mother    Hypertension Father     Social History   Socioeconomic History   Marital status: Married    Spouse name: Not on file   Number of  children: Not on file   Years of education: Not on file   Highest education level: Not on file  Occupational History   Not on file  Tobacco Use   Smoking status: Former   Smokeless tobacco: Former  Substance and Sexual Activity   Alcohol use: Never   Drug use: Never   Sexual activity: Yes  Other Topics Concern   Not on file  Social History Narrative   Not on file   Social Determinants of Health   Financial Resource Strain: Not on file  Food Insecurity: Not on file  Transportation Needs: Not on file  Physical Activity: Not on file  Stress: Not on file  Social Connections: Not on file  Intimate Partner Violence: Not on file   Review of Systems  Constitutional:  Negative for chills and fever.  HENT:  Negative for sore throat.   Respiratory:  Negative for cough and shortness of breath.   Cardiovascular:  Negative for chest pain, palpitations and leg swelling.  Gastrointestinal:  Negative for abdominal pain, blood in stool, constipation, diarrhea, nausea and vomiting.  Genitourinary:  Negative for dysuria and hematuria.  Musculoskeletal:  Negative for myalgias.  Skin:  Negative for itching and rash.  Neurological:  Negative for dizziness and headaches.  Psychiatric/Behavioral:  Negative for depression and suicidal ideas.    Objective    BP 130/80   Pulse (!) 57   Ht $R'5\' 9"'bN$  (1.753 m)   Wt 178 lb 6.4 oz (80.9 kg)   SpO2 99%   BMI 26.35  kg/m   Physical Exam Vitals reviewed.  Constitutional:      General: He is not in acute distress.    Appearance: Normal appearance. He is not ill-appearing.  HENT:     Head: Normocephalic and atraumatic.     Nose: Nose normal. No congestion or rhinorrhea.     Mouth/Throat:     Mouth: Mucous membranes are moist.     Pharynx: Oropharynx is clear.  Eyes:     Extraocular Movements: Extraocular movements intact.     Conjunctiva/sclera: Conjunctivae normal.     Pupils: Pupils are equal, round, and reactive to light.  Cardiovascular:      Rate and Rhythm: Normal rate and regular rhythm.     Pulses: Normal pulses.     Heart sounds: Normal heart sounds. No murmur heard. Pulmonary:     Effort: Pulmonary effort is normal.     Breath sounds: Normal breath sounds. No wheezing, rhonchi or rales.  Abdominal:     General: Abdomen is flat. Bowel sounds are normal. There is no distension.     Palpations: Abdomen is soft.     Tenderness: There is no abdominal tenderness.  Musculoskeletal:        General: No swelling or deformity. Normal range of motion.     Cervical back: Normal range of motion.  Skin:    General: Skin is warm and dry.     Capillary Refill: Capillary refill takes less than 2 seconds.  Neurological:     General: No focal deficit present.     Mental Status: He is alert and oriented to person, place, and time.     Motor: No weakness.  Psychiatric:        Mood and Affect: Mood normal.        Behavior: Behavior normal.        Thought Content: Thought content normal.    Last CBC Lab Results  Component Value Date   WBC 6.7 09/02/2021   HGB 16.3 09/02/2021   HCT 46.8 09/02/2021   MCV 89 09/02/2021   MCH 30.8 09/02/2021   RDW 13.1 09/02/2021   PLT 231 99/37/1696   Last metabolic panel Lab Results  Component Value Date   GLUCOSE 95 09/02/2021   NA 138 09/02/2021   K 4.1 09/02/2021   CL 100 09/02/2021   CO2 28 09/02/2021   BUN 15 09/02/2021   CREATININE 1.09 09/02/2021   EGFR 89 09/02/2021   CALCIUM 9.5 09/02/2021   PROT 6.6 09/02/2021   ALBUMIN 4.6 09/02/2021   LABGLOB 2.0 09/02/2021   AGRATIO 2.3 (H) 09/02/2021   BILITOT 0.5 09/02/2021   ALKPHOS 71 09/02/2021   AST 31 09/02/2021   ALT 42 09/02/2021   Last lipids Lab Results  Component Value Date   CHOL 198 09/02/2021   HDL 58 09/02/2021   LDLCALC 125 (H) 09/02/2021   TRIG 85 09/02/2021   CHOLHDL 3.4 09/02/2021   Last hemoglobin A1c No results found for: "HGBA1C" Last thyroid functions Lab Results  Component Value Date   TSH 3.100  09/02/2021      Assessment & Plan:   Problem List Items Addressed This Visit       Cardiovascular and Mediastinum   Essential hypertension    BP 130/80 today.  He states that he has most recently been prescribed amlodipine 10 mg daily and HCTZ 25 mg daily.  However, he has not taken his medications recently due to side effects, namely feeling fatigued.  Previously he was treated  with telmisartan, however per chart review this medication was discontinued due to persistently elevated BP.  Appears that the maximum dose he was prescribed was 40 mg. -Restart telmisartan 40 mg daily today.  We will plan for follow-up in 2 weeks for BP check and BMP.  If his BP remains above goal at that time, plan to further increase telmisartan to 80 mg.      Relevant Medications   telmisartan (MICARDIS) 40 MG tablet     Endocrine   Hypogonadism in male    He is currently receiving testosterone injections from a health clinic in [place], [place].  He states that they checked lab work on him, including AM testosterone level, and found his testosterone level to be low.  I have quested records from this clinic for review as they do not appear in [facility].        Other   Anxiety    Well-controlled on Zoloft.  He has previously been prescribed Klonopin as well, last refilled in September 2022 per PDMP.  He has not needed to take Klonopin recently and is in agreement with discontinuing the prescription today.      Preventative health care - Primary    Presenting today to establish care. -Baseline lab work ordered today, including one-time screenings for both HIV and HCV -Declined outstanding vaccines today.  He is checking to see when his last Tdap vaccine was and may consider vaccination if it has been > 10 years.      Relevant Orders   CBC with Differential   CMP14+EGFR   Lipid Profile   B12 and Folate Panel   Vitamin D (25 hydroxy)   HCV Ab w Reflex to Quant PCR   HIV antibody (with reflex)    HgB A1c    Return in about 2 weeks (around 04/20/2022) for HTN follow up.   Johnette Abraham, MD

## 2022-04-06 NOTE — Assessment & Plan Note (Signed)
Presenting today to establish care. -Baseline lab work ordered today, including one-time screenings for both HIV and HCV -Declined outstanding vaccines today.  He is checking to see when his last Tdap vaccine was and may consider vaccination if it has been > 10 years.

## 2022-04-06 NOTE — Assessment & Plan Note (Addendum)
He is currently receiving testosterone injections from a health clinic in [place], [place].  He states that they checked lab work on him, including AM testosterone level, and found his testosterone level to be low.  I have quested records from this clinic for review as they do not appear in [employer].

## 2022-04-06 NOTE — Patient Instructions (Signed)
It was a pleasure to see you today.  Thank you for giving [place] the opportunity to be involved in your care.  Below is a brief recap of your visit and next steps.  We will plan to see you again in 2-4 weeks.  Summary We are starting telmisartan 40 mg daily and checking labs  Next steps Plan for follow up in 2-4 weeks to follow up on your blood pressure

## 2022-04-06 NOTE — Assessment & Plan Note (Signed)
BP 130/80 today.  He states that he has most recently been prescribed amlodipine 10 mg daily and HCTZ 25 mg daily.  However, he has not taken his medications recently due to side effects, namely feeling fatigued.  Previously he was treated with telmisartan, however per chart review this medication was discontinued due to persistently elevated BP.  Appears that the maximum dose he was prescribed was 40 mg. -Restart telmisartan 40 mg daily today.  We will plan for follow-up in 2 weeks for BP check and BMP.  If his BP remains above goal at that time, plan to further increase telmisartan to 80 mg.

## 2022-04-07 LAB — LIPID PANEL
Chol/HDL Ratio: 3.1 ratio (ref 0.0–5.0)
Cholesterol, Total: 170 mg/dL (ref 100–199)
HDL: 54 mg/dL (ref >39)
LDL Chol Calc (NIH): 102 mg/dL — ABNORMAL HIGH (ref 0–99)
Triglycerides: 72 mg/dL (ref 0–149)
VLDL Cholesterol Cal: 14 mg/dL (ref 5–40)

## 2022-04-07 LAB — CBC WITH DIFFERENTIAL/PLATELET
Basophils Absolute: 0 10*3/uL (ref 0.0–0.2)
Basos: 0 %
EOS (ABSOLUTE): 0.1 10*3/uL (ref 0.0–0.4)
Eos: 2 %
Hematocrit: 43.9 % (ref 37.5–51.0)
Hemoglobin: 15.3 g/dL (ref 13.0–17.7)
Immature Grans (Abs): 0 10*3/uL (ref 0.0–0.1)
Immature Granulocytes: 0 %
Lymphocytes Absolute: 1.9 10*3/uL (ref 0.7–3.1)
Lymphs: 30 %
MCH: 31.2 pg (ref 26.6–33.0)
MCHC: 34.9 g/dL (ref 31.5–35.7)
MCV: 89 fL (ref 79–97)
Monocytes Absolute: 0.4 10*3/uL (ref 0.1–0.9)
Monocytes: 6 %
Neutrophils Absolute: 4 10*3/uL (ref 1.4–7.0)
Neutrophils: 62 %
Platelets: 188 10*3/uL (ref 150–450)
RBC: 4.91 x10E6/uL (ref 4.14–5.80)
RDW: 13.4 % (ref 11.6–15.4)
WBC: 6.4 10*3/uL (ref 3.4–10.8)

## 2022-04-07 LAB — CMP14+EGFR
ALT: 22 IU/L (ref 0–44)
AST: 25 IU/L (ref 0–40)
Albumin/Globulin Ratio: 2 (ref 1.2–2.2)
Albumin: 4.5 g/dL (ref 4.1–5.1)
Alkaline Phosphatase: 43 IU/L — ABNORMAL LOW (ref 44–121)
BUN/Creatinine Ratio: 13 (ref 9–20)
BUN: 14 mg/dL (ref 6–24)
Bilirubin Total: 0.8 mg/dL (ref 0.0–1.2)
CO2: 27 mmol/L (ref 20–29)
Calcium: 9.2 mg/dL (ref 8.7–10.2)
Chloride: 102 mmol/L (ref 96–106)
Creatinine, Ser: 1.08 mg/dL (ref 0.76–1.27)
Globulin, Total: 2.2 g/dL (ref 1.5–4.5)
Glucose: 71 mg/dL (ref 70–99)
Potassium: 4.1 mmol/L (ref 3.5–5.2)
Sodium: 140 mmol/L (ref 134–144)
Total Protein: 6.7 g/dL (ref 6.0–8.5)
eGFR: 89 mL/min/{1.73_m2} (ref >59)

## 2022-04-07 LAB — HCV INTERPRETATION

## 2022-04-07 LAB — B12 AND FOLATE PANEL
Folate: 13.3 ng/mL (ref >3.0)
Vitamin B-12: 410 pg/mL (ref 232–1245)

## 2022-04-07 LAB — VITAMIN D 25 HYDROXY (VIT D DEFICIENCY, FRACTURES): Vit D, 25-Hydroxy: 59.6 ng/mL (ref 30.0–100.0)

## 2022-04-07 LAB — HCV AB W REFLEX TO QUANT PCR: HCV Ab: NONREACTIVE

## 2022-04-07 LAB — HEMOGLOBIN A1C
Est. average glucose Bld gHb Est-mCnc: 103 mg/dL
Hgb A1c MFr Bld: 5.2 % (ref 4.8–5.6)

## 2022-04-07 LAB — HIV ANTIBODY (ROUTINE TESTING W REFLEX): HIV Screen 4th Generation wRfx: NONREACTIVE

## 2022-04-20 ENCOUNTER — Ambulatory Visit: Payer: 59 | Admitting: Internal Medicine

## 2022-05-12 ENCOUNTER — Other Ambulatory Visit: Payer: Self-pay | Admitting: Internal Medicine

## 2022-05-12 ENCOUNTER — Other Ambulatory Visit: Payer: Self-pay | Admitting: Family Medicine

## 2022-05-12 DIAGNOSIS — F429 Obsessive-compulsive disorder, unspecified: Secondary | ICD-10-CM

## 2022-06-15 ENCOUNTER — Other Ambulatory Visit: Payer: Self-pay | Admitting: Internal Medicine

## 2022-06-15 DIAGNOSIS — F429 Obsessive-compulsive disorder, unspecified: Secondary | ICD-10-CM

## 2022-10-25 ENCOUNTER — Encounter: Payer: Self-pay | Admitting: Orthopaedic Surgery

## 2022-10-25 ENCOUNTER — Ambulatory Visit: Payer: 59 | Admitting: Orthopaedic Surgery

## 2022-10-25 ENCOUNTER — Other Ambulatory Visit (INDEPENDENT_AMBULATORY_CARE_PROVIDER_SITE_OTHER): Payer: 59

## 2022-10-25 VITALS — BP 157/105 | HR 72 | Ht 69.0 in | Wt 174.8 lb

## 2022-10-25 DIAGNOSIS — M545 Low back pain, unspecified: Secondary | ICD-10-CM

## 2022-10-25 MED ORDER — PREDNISONE 10 MG (21) PO TBPK: ORAL_TABLET | ORAL | 0 refills | Status: AC

## 2022-10-25 MED ORDER — METHOCARBAMOL 500 MG PO TABS: 500.0000 mg | ORAL_TABLET | Freq: Four times a day (QID) | ORAL | 1 refills | Status: AC
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
## 2022-10-27 ENCOUNTER — Encounter: Payer: Self-pay | Admitting: Orthopaedic Surgery

## 2022-11-01 ENCOUNTER — Ambulatory Visit: Payer: 59 | Admitting: Orthopaedic Surgery

## 2023-01-04 ENCOUNTER — Encounter: Payer: Self-pay | Admitting: Orthopaedic Surgery

## 2023-01-04 ENCOUNTER — Ambulatory Visit (INDEPENDENT_AMBULATORY_CARE_PROVIDER_SITE_OTHER): Payer: 59 | Admitting: Orthopaedic Surgery

## 2023-01-04 ENCOUNTER — Other Ambulatory Visit (INDEPENDENT_AMBULATORY_CARE_PROVIDER_SITE_OTHER): Payer: 59

## 2023-01-04 VITALS — Ht 69.0 in | Wt 175.0 lb

## 2023-01-04 DIAGNOSIS — G8929 Other chronic pain: Secondary | ICD-10-CM

## 2023-01-04 DIAGNOSIS — M545 Low back pain, unspecified: Secondary | ICD-10-CM | POA: Diagnosis not present

## 2023-01-04 NOTE — Progress Notes (Signed)
Office Visit Note   Patient: Robert Walsh           Date of Birth: 03/19/1981           MRN: [id number] Visit Date: 01/04/2023              Requested by: Billie Lade, MD [address] [place],  [place] [address] PCP: Billie Lade, MD   Assessment & Plan: Visit Diagnoses:  1. Chronic bilateral low back pain, unspecified whether sciatica present     Plan: Patient's had ongoing symptoms for several years with gradual recurrent more painful episodes.  He has been through conservative treatment different medications rest activity modification, muscle relaxants, prednisone Dosepaks.  Would recommend proceeding with the MRI scan lumbar office follow-up after scan for review.  Follow-Up Instructions: No follow-ups on file.   Orders:  Orders Placed This Encounter  Procedures   XR Lumbar Spine 2-3 Views   MR Lumbar Spine w/o contrast   No orders of the defined types were placed in this encounter.     Procedures: No procedures performed   Clinical Data: No additional findings.   Subjective: Chief Complaint  Patient presents with   Lower Back - Pain    HPI 42 year old male returns with ongoing back pain with significant increase in pain recently with pain down his left leg.  Has been treated in the past with muscle relaxants anti-inflammatories exercise program, muscle relaxants, prednisone Dosepaks.  He said to cut back on his normal workout activity in the past but states he has not been able to work out for the last few weeks with increased symptoms.  Plain radiograph shows some mild L5 pars narrowing defect versus hypoplastic pars with 3 mm anterolisthesis at L5-S1 that shifts 1 mm on flexion-extension.  Denies any history of injury he has played some basketball when he was younger.  Does not really recollect any incidences of his significant injuries.  Review of Systems all systems are noncontributory to HPI.  Does have a history of kidney stones and remote  history of stent placement.   Objective: Vital Signs: Ht 5\' 9"  (1.753 m)   Wt 175 lb (79.4 kg)   BMI 25.84 kg/m   Physical Exam Constitutional:      Appearance: He is well-developed.  HENT:     Head: Normocephalic and atraumatic.     Right Ear: External ear normal.     Left Ear: External ear normal.  Eyes:     Pupils: Pupils are equal, round, and reactive to light.  Neck:     Thyroid: No thyromegaly.     Trachea: No tracheal deviation.  Cardiovascular:     Rate and Rhythm: Normal rate.  Pulmonary:     Effort: Pulmonary effort is normal.     Breath sounds: No wheezing.  Abdominal:     General: Bowel sounds are normal.     Palpations: Abdomen is soft.  Musculoskeletal:     Cervical back: Neck supple.  Skin:    General: Skin is warm and dry.     Capillary Refill: Capillary refill takes less than 2 seconds.  Neurological:     Mental Status: He is alert and oriented to person, place, and time.  Psychiatric:        Behavior: Behavior normal.        Thought Content: Thought content normal.        Judgment: Judgment normal.     Ortho Exam patient  has some sciatic notch tenderness on the left no tight hamstrings fingertips to dorsal midfoot bilaterally.  Negative popliteal compression test he is able to heel and toe walk.  Anterior tib gastrocsoleus is strong.  Specialty Comments:  No specialty comments available.  Imaging: XR Lumbar Spine 2-3 Views  Result Date: 01/04/2023 Right left lumbar obliques obtained and reviewed.  Pars at L5 appeared to be narrow versus hypoplastic suggesting of defect. Impression: Oblique radiographs suspicious for bilateral L5 pars defect or hypoplastic pars.    PMFS History: Patient Active Problem List   Diagnosis Date Noted   Hypogonadism in male 04/06/2022   Preventative health care 04/06/2022   Anxiety 08/10/2021   History of nephrolithiasis 06/14/2020   Low back pain 03/17/2020   Essential hypertension 02/28/2018   OCD (obsessive  compulsive disorder) 03/02/2013   Past Medical History:  Diagnosis Date   Depression    Insomnia    Obsessive compulsive disorder     Family History  Problem Relation Age of Onset   Healthy Mother    Hypertension Father     No past surgical history on file. Social History   Occupational History   Not on file  Tobacco Use   Smoking status: Former   Smokeless tobacco: Former  Substance and Sexual Activity   Alcohol use: Never   Drug use: Never   Sexual activity: Yes

## 2023-03-26 ENCOUNTER — Encounter: Payer: Self-pay | Admitting: Orthopaedic Surgery

## 2023-03-26 MED ORDER — METHYLPREDNISOLONE 4 MG PO TBPK: ORAL_TABLET | ORAL | 0 refills | Status: AC

## 2023-11-16 DIAGNOSIS — I10 Essential (primary) hypertension: Secondary | ICD-10-CM | POA: Diagnosis not present

## 2023-11-16 DIAGNOSIS — F429 Obsessive-compulsive disorder, unspecified: Secondary | ICD-10-CM | POA: Diagnosis not present

## 2023-11-16 DIAGNOSIS — G8929 Other chronic pain: Secondary | ICD-10-CM | POA: Diagnosis not present

## 2023-11-16 DIAGNOSIS — J302 Other seasonal allergic rhinitis: Secondary | ICD-10-CM | POA: Diagnosis not present

## 2023-11-16 DIAGNOSIS — M545 Low back pain, unspecified: Secondary | ICD-10-CM | POA: Diagnosis not present

## 2023-11-16 DIAGNOSIS — F32A Depression, unspecified: Secondary | ICD-10-CM | POA: Diagnosis not present

## 2023-11-29 DIAGNOSIS — M545 Low back pain, unspecified: Secondary | ICD-10-CM | POA: Diagnosis not present

## 2023-12-05 DIAGNOSIS — R6882 Decreased libido: Secondary | ICD-10-CM | POA: Diagnosis not present

## 2023-12-05 DIAGNOSIS — R7989 Other specified abnormal findings of blood chemistry: Secondary | ICD-10-CM | POA: Diagnosis not present

## 2023-12-05 DIAGNOSIS — G8929 Other chronic pain: Secondary | ICD-10-CM | POA: Diagnosis not present

## 2023-12-05 DIAGNOSIS — I10 Essential (primary) hypertension: Secondary | ICD-10-CM | POA: Diagnosis not present

## 2023-12-05 DIAGNOSIS — F419 Anxiety disorder, unspecified: Secondary | ICD-10-CM | POA: Diagnosis not present
